# Patient Record
Sex: Male | Born: 1960 | Race: White | Hispanic: No | Marital: Married | Smoking: Former smoker
Health system: Southern US, Community
[De-identification: ages and names within clinical notes are randomized; demographics above are authoritative.]

## PROBLEM LIST (undated history)

## (undated) DIAGNOSIS — E785 Hyperlipidemia, unspecified: Secondary | ICD-10-CM

## (undated) DIAGNOSIS — I10 Essential (primary) hypertension: Secondary | ICD-10-CM

## (undated) DIAGNOSIS — E669 Obesity, unspecified: Secondary | ICD-10-CM

## (undated) DIAGNOSIS — I2510 Atherosclerotic heart disease of native coronary artery without angina pectoris: Secondary | ICD-10-CM

## (undated) HISTORY — DX: Obesity, unspecified: E66.9

## (undated) HISTORY — PX: OTHER SURGICAL HISTORY: SHX169

## (undated) HISTORY — PX: WISDOM TOOTH EXTRACTION: SHX21

## (undated) HISTORY — DX: Essential (primary) hypertension: I10

## (undated) HISTORY — DX: Hyperlipidemia, unspecified: E78.5

## (undated) HISTORY — DX: Atherosclerotic heart disease of native coronary artery without angina pectoris: I25.10

---

## 2005-10-05 ENCOUNTER — Emergency Department (HOSPITAL_COMMUNITY): Admission: EM | Admit: 2005-10-05 | Discharge: 2005-10-05 | Payer: Self-pay | Admitting: Emergency Medicine

## 2011-02-02 ENCOUNTER — Inpatient Hospital Stay (HOSPITAL_COMMUNITY): Payer: BC Managed Care – PPO

## 2011-02-02 ENCOUNTER — Emergency Department (HOSPITAL_COMMUNITY): Payer: BC Managed Care – PPO

## 2011-02-02 ENCOUNTER — Inpatient Hospital Stay (HOSPITAL_COMMUNITY)
Admission: EM | Admit: 2011-02-02 | Discharge: 2011-02-08 | DRG: 546 | Disposition: A | Discharge status: Home / Self Care | Payer: BC Managed Care – PPO | Source: Ambulatory Visit | Attending: Cardiothoracic Surgery | Admitting: Cardiothoracic Surgery

## 2011-02-02 DIAGNOSIS — R000 Tachycardia, unspecified: Secondary | ICD-10-CM | POA: Diagnosis not present

## 2011-02-02 DIAGNOSIS — D62 Acute posthemorrhagic anemia: Secondary | ICD-10-CM | POA: Diagnosis not present

## 2011-02-02 DIAGNOSIS — I2582 Chronic total occlusion of coronary artery: Secondary | ICD-10-CM | POA: Diagnosis present

## 2011-02-02 DIAGNOSIS — E8779 Other fluid overload: Secondary | ICD-10-CM | POA: Diagnosis not present

## 2011-02-02 DIAGNOSIS — I10 Essential (primary) hypertension: Secondary | ICD-10-CM | POA: Diagnosis present

## 2011-02-02 DIAGNOSIS — I2119 ST elevation (STEMI) myocardial infarction involving other coronary artery of inferior wall: Principal | ICD-10-CM | POA: Diagnosis present

## 2011-02-02 DIAGNOSIS — Z87891 Personal history of nicotine dependence: Secondary | ICD-10-CM

## 2011-02-02 DIAGNOSIS — E669 Obesity, unspecified: Secondary | ICD-10-CM | POA: Diagnosis present

## 2011-02-02 DIAGNOSIS — Z7902 Long term (current) use of antithrombotics/antiplatelets: Secondary | ICD-10-CM

## 2011-02-02 DIAGNOSIS — I2510 Atherosclerotic heart disease of native coronary artery without angina pectoris: Secondary | ICD-10-CM | POA: Diagnosis present

## 2011-02-02 DIAGNOSIS — R569 Unspecified convulsions: Secondary | ICD-10-CM | POA: Diagnosis present

## 2011-02-02 DIAGNOSIS — R079 Chest pain, unspecified: Secondary | ICD-10-CM

## 2011-02-02 DIAGNOSIS — Z7982 Long term (current) use of aspirin: Secondary | ICD-10-CM

## 2011-02-02 DIAGNOSIS — I4901 Ventricular fibrillation: Secondary | ICD-10-CM

## 2011-02-02 DIAGNOSIS — I469 Cardiac arrest, cause unspecified: Secondary | ICD-10-CM | POA: Diagnosis present

## 2011-02-02 DIAGNOSIS — Z8249 Family history of ischemic heart disease and other diseases of the circulatory system: Secondary | ICD-10-CM

## 2011-02-02 DIAGNOSIS — Z79899 Other long term (current) drug therapy: Secondary | ICD-10-CM

## 2011-02-02 DIAGNOSIS — E785 Hyperlipidemia, unspecified: Secondary | ICD-10-CM | POA: Diagnosis present

## 2011-02-02 HISTORY — PX: CARDIAC CATHETERIZATION: SHX172

## 2011-02-02 LAB — POCT I-STAT 4, (NA,K, GLUC, HGB,HCT)
Glucose, Bld: 160 mg/dL — ABNORMAL HIGH (ref 70–99)
Glucose, Bld: 119 mg/dL — ABNORMAL HIGH (ref 70–99)
HCT: 43 % (ref 39.0–52.0)
HCT: 28 % — ABNORMAL LOW (ref 39.0–52.0)
HCT: 42 % (ref 39.0–52.0)
HCT: 30 % — ABNORMAL LOW (ref 39.0–52.0)
HCT: 36 % — ABNORMAL LOW (ref 39.0–52.0)
Hemoglobin: 14.3 g/dL (ref 13.0–17.0)
Potassium: 4.3 mEq/L (ref 3.5–5.1)
Potassium: 4.3 mEq/L (ref 3.5–5.1)
Potassium: 5 mEq/L (ref 3.5–5.1)
Potassium: 3.9 mEq/L (ref 3.5–5.1)
Sodium: 135 mEq/L (ref 135–145)
Sodium: 128 mEq/L — ABNORMAL LOW (ref 135–145)
Sodium: 136 mEq/L (ref 135–145)

## 2011-02-02 LAB — CBC
HCT: 40.4 % (ref 39.0–52.0)
HCT: 35.8 % — ABNORMAL LOW (ref 39.0–52.0)
HCT: 33.5 % — ABNORMAL LOW (ref 39.0–52.0)
Hemoglobin: 12.9 g/dL — ABNORMAL LOW (ref 13.0–17.0)
Hemoglobin: 11.8 g/dL — ABNORMAL LOW (ref 13.0–17.0)
MCH: 31.8 pg (ref 26.0–34.0)
MCH: 31.5 pg (ref 26.0–34.0)
MCH: 31.1 pg (ref 26.0–34.0)
MCH: 31.1 pg (ref 26.0–34.0)
MCHC: 36.7 g/dL — ABNORMAL HIGH (ref 30.0–36.0)
MCHC: 36 g/dL (ref 30.0–36.0)
MCHC: 35.2 g/dL (ref 30.0–36.0)
MCV: 86.6 fL (ref 78.0–100.0)
MCV: 86.3 fL (ref 78.0–100.0)
MCV: 88.2 fL (ref 78.0–100.0)
Platelets: 284 10*3/uL (ref 150–400)
Platelets: 171 10*3/uL (ref 150–400)
Platelets: 156 10*3/uL (ref 150–400)
RBC: 5.31 MIL/uL (ref 4.22–5.81)
RBC: 4.6 MIL/uL (ref 4.22–5.81)
RBC: 4.15 MIL/uL — ABNORMAL LOW (ref 4.22–5.81)
RBC: 3.8 MIL/uL — ABNORMAL LOW (ref 4.22–5.81)
RDW: 12.2 % (ref 11.5–15.5)
RDW: 12.3 % (ref 11.5–15.5)
WBC: 12.5 10*3/uL — ABNORMAL HIGH (ref 4.0–10.5)
WBC: 14.6 10*3/uL — ABNORMAL HIGH (ref 4.0–10.5)
WBC: 11.9 10*3/uL — ABNORMAL HIGH (ref 4.0–10.5)

## 2011-02-02 LAB — POCT I-STAT 3, ART BLOOD GAS (G3+)
Acid-base deficit: 5 mmol/L — ABNORMAL HIGH (ref 0.0–2.0)
Acid-base deficit: 4 mmol/L — ABNORMAL HIGH (ref 0.0–2.0)
Acid-base deficit: 4 mmol/L — ABNORMAL HIGH (ref 0.0–2.0)
Bicarbonate: 21.2 mEq/L (ref 20.0–24.0)
Bicarbonate: 21.9 mEq/L (ref 20.0–24.0)
Bicarbonate: 22.4 mEq/L (ref 20.0–24.0)
O2 Saturation: 100 %
O2 Saturation: 93 %
O2 Saturation: 97 %
Patient temperature: 37.7
TCO2: 23 mmol/L (ref 0–100)
TCO2: 23 mmol/L (ref 0–100)
pCO2 arterial: 42.1 mmHg (ref 35.0–45.0)
pCO2 arterial: 39.1 mmHg (ref 35.0–45.0)
pH, Arterial: 7.357 (ref 7.350–7.450)
pO2, Arterial: 323 mmHg — ABNORMAL HIGH (ref 80.0–100.0)
pO2, Arterial: 119 mmHg — ABNORMAL HIGH (ref 80.0–100.0)

## 2011-02-02 LAB — PROTIME-INR
INR: 1.27 (ref 0.00–1.49)
INR: 1.39 (ref 0.00–1.49)
Prothrombin Time: 17.3 seconds — ABNORMAL HIGH (ref 11.6–15.2)

## 2011-02-02 LAB — DIFFERENTIAL
Basophils Relative: 1 % (ref 0–1)
Eosinophils Relative: 3 % (ref 0–5)
Lymphs Abs: 2.6 10*3/uL (ref 0.7–4.0)
Neutrophils Relative %: 56 % (ref 43–77)

## 2011-02-02 LAB — MAGNESIUM: Magnesium: 2.6 mg/dL — ABNORMAL HIGH (ref 1.5–2.5)

## 2011-02-02 LAB — POCT I-STAT 3, VENOUS BLOOD GAS (G3P V)
Bicarbonate: 20 mEq/L (ref 20.0–24.0)
O2 Saturation: 74 %
TCO2: 21 mmol/L (ref 0–100)
pCO2, Ven: 44 mmHg — ABNORMAL LOW (ref 45.0–50.0)

## 2011-02-02 LAB — COMPREHENSIVE METABOLIC PANEL
ALT: 35 U/L (ref 0–53)
AST: 26 U/L (ref 0–37)
AST: 46 U/L — ABNORMAL HIGH (ref 0–37)
Alkaline Phosphatase: 56 U/L (ref 39–117)
BUN: 14 mg/dL (ref 6–23)
Creatinine, Ser: 0.84 mg/dL (ref 0.50–1.35)
GFR calc Af Amer: >60 mL/min (ref >60)
GFR calc Af Amer: >60 mL/min (ref >60)
GFR calc non Af Amer: >60 mL/min (ref >60)
Sodium: 135 mEq/L (ref 135–145)
Sodium: 133 mEq/L — ABNORMAL LOW (ref 135–145)
Total Bilirubin: 0.6 mg/dL (ref 0.3–1.2)
Total Protein: 6 g/dL (ref 6.0–8.3)

## 2011-02-02 LAB — CREATININE, SERUM
Creatinine, Ser: 0.75 mg/dL (ref 0.50–1.35)
GFR calc Af Amer: >60 mL/min (ref >60)
GFR calc non Af Amer: >60 mL/min (ref >60)

## 2011-02-02 LAB — GLUCOSE, CAPILLARY

## 2011-02-02 LAB — POCT I-STAT, CHEM 8
BUN: 15 mg/dL (ref 6–23)
Calcium, Ion: 1.25 mmol/L (ref 1.12–1.32)
Calcium, Ion: 1.36 mmol/L — ABNORMAL HIGH (ref 1.12–1.32)
Chloride: 103 mEq/L (ref 96–112)
Creatinine, Ser: 1 mg/dL (ref 0.50–1.35)
Creatinine, Ser: 0.8 mg/dL (ref 0.50–1.35)
Glucose, Bld: 135 mg/dL — ABNORMAL HIGH (ref 70–99)
Glucose, Bld: 162 mg/dL — ABNORMAL HIGH (ref 70–99)
HCT: 33 % — ABNORMAL LOW (ref 39.0–52.0)
Hemoglobin: 17.3 g/dL — ABNORMAL HIGH (ref 13.0–17.0)
Hemoglobin: 11.2 g/dL — ABNORMAL LOW (ref 13.0–17.0)
Potassium: 4.3 mEq/L (ref 3.5–5.1)

## 2011-02-02 LAB — CARDIAC PANEL(CRET KIN+CKTOT+MB+TROPI)
CK, MB: 3.7 ng/mL (ref 0.3–4.0)
Relative Index: 2.7 — ABNORMAL HIGH (ref 0.0–2.5)
Total CK: 139 U/L (ref 7–232)
Troponin I: <0.3 ng/mL (ref <0.30)

## 2011-02-02 LAB — HEMOGLOBIN A1C: Mean Plasma Glucose: 111 mg/dL (ref <117)

## 2011-02-02 LAB — PLATELET COUNT: Platelets: 170 10*3/uL (ref 150–400)

## 2011-02-02 LAB — APTT: aPTT: 31 seconds (ref 24–37)

## 2011-02-02 LAB — POCT I-STAT GLUCOSE: Glucose, Bld: 154 mg/dL — ABNORMAL HIGH (ref 70–99)

## 2011-02-03 ENCOUNTER — Inpatient Hospital Stay (HOSPITAL_COMMUNITY): Payer: BC Managed Care – PPO

## 2011-02-03 HISTORY — PX: CORONARY ARTERY BYPASS GRAFT: SHX141

## 2011-02-03 LAB — POCT I-STAT 3, ART BLOOD GAS (G3+)
Bicarbonate: 26.9 mEq/L — ABNORMAL HIGH (ref 20.0–24.0)
O2 Saturation: 94 %
pCO2 arterial: 50.8 mmHg — ABNORMAL HIGH (ref 35.0–45.0)
pCO2 arterial: 51.1 mmHg — ABNORMAL HIGH (ref 35.0–45.0)
pH, Arterial: 7.317 — ABNORMAL LOW (ref 7.350–7.450)
pH, Arterial: 7.332 — ABNORMAL LOW (ref 7.350–7.450)
pO2, Arterial: 75 mmHg — ABNORMAL LOW (ref 80.0–100.0)
pO2, Arterial: 80 mmHg (ref 80.0–100.0)

## 2011-02-03 LAB — CREATININE, SERUM
Creatinine, Ser: 0.86 mg/dL (ref 0.50–1.35)
GFR calc Af Amer: >60 mL/min (ref >60)
GFR calc non Af Amer: >60 mL/min (ref >60)

## 2011-02-03 LAB — BASIC METABOLIC PANEL
BUN: 9 mg/dL (ref 6–23)
CO2: 28 mEq/L (ref 19–32)
Calcium: 8.8 mg/dL (ref 8.4–10.5)
Chloride: 105 mEq/L (ref 96–112)
Creatinine, Ser: 0.89 mg/dL (ref 0.50–1.35)
GFR calc Af Amer: >60 mL/min (ref >60)
GFR calc non Af Amer: >60 mL/min (ref >60)
Glucose, Bld: 158 mg/dL — ABNORMAL HIGH (ref 70–99)
Potassium: 3.5 mEq/L (ref 3.5–5.1)
Sodium: 141 mEq/L (ref 135–145)

## 2011-02-03 LAB — CBC
HCT: 32.3 % — ABNORMAL LOW (ref 39.0–52.0)
HCT: 33 % — ABNORMAL LOW (ref 39.0–52.0)
Hemoglobin: 11.5 g/dL — ABNORMAL LOW (ref 13.0–17.0)
Hemoglobin: 11.4 g/dL — ABNORMAL LOW (ref 13.0–17.0)
MCH: 31.5 pg (ref 26.0–34.0)
MCH: 31.3 pg (ref 26.0–34.0)
MCHC: 35.6 g/dL (ref 30.0–36.0)
MCHC: 34.5 g/dL (ref 30.0–36.0)
MCV: 88.5 fL (ref 78.0–100.0)
MCV: 90.7 fL (ref 78.0–100.0)
Platelets: 144 10*3/uL — ABNORMAL LOW (ref 150–400)
Platelets: 131 10*3/uL — ABNORMAL LOW (ref 150–400)
RBC: 3.65 MIL/uL — ABNORMAL LOW (ref 4.22–5.81)
RBC: 3.64 MIL/uL — ABNORMAL LOW (ref 4.22–5.81)
RDW: 12.4 % (ref 11.5–15.5)
RDW: 12.6 % (ref 11.5–15.5)
WBC: 12.5 10*3/uL — ABNORMAL HIGH (ref 4.0–10.5)
WBC: 15.9 10*3/uL — ABNORMAL HIGH (ref 4.0–10.5)

## 2011-02-03 LAB — GLUCOSE, CAPILLARY
Glucose-Capillary: 85 mg/dL (ref 70–99)
Glucose-Capillary: 108 mg/dL — ABNORMAL HIGH (ref 70–99)

## 2011-02-03 LAB — PROTIME-INR
INR: 1.36 (ref 0.00–1.49)
Prothrombin Time: 17 seconds — ABNORMAL HIGH (ref 11.6–15.2)

## 2011-02-03 LAB — POCT I-STAT, CHEM 8
BUN: 11 mg/dL (ref 6–23)
Calcium, Ion: 1.26 mmol/L (ref 1.12–1.32)
Chloride: 99 mEq/L (ref 96–112)
Creatinine, Ser: 1 mg/dL (ref 0.50–1.35)
Glucose, Bld: 171 mg/dL — ABNORMAL HIGH (ref 70–99)
HCT: 34 % — ABNORMAL LOW (ref 39.0–52.0)
Potassium: 3.9 mEq/L (ref 3.5–5.1)

## 2011-02-03 LAB — MAGNESIUM
Magnesium: 2.2 mg/dL (ref 1.5–2.5)
Magnesium: 2.1 mg/dL (ref 1.5–2.5)

## 2011-02-04 ENCOUNTER — Inpatient Hospital Stay (HOSPITAL_COMMUNITY): Payer: BC Managed Care – PPO

## 2011-02-04 LAB — CBC
HCT: 31.9 % — ABNORMAL LOW (ref 39.0–52.0)
Hemoglobin: 11.1 g/dL — ABNORMAL LOW (ref 13.0–17.0)
MCH: 31.4 pg (ref 26.0–34.0)
MCHC: 34.8 g/dL (ref 30.0–36.0)
MCV: 90.1 fL (ref 78.0–100.0)
Platelets: 131 10*3/uL — ABNORMAL LOW (ref 150–400)
RBC: 3.54 MIL/uL — ABNORMAL LOW (ref 4.22–5.81)
RDW: 12.6 % (ref 11.5–15.5)
WBC: 15.7 10*3/uL — ABNORMAL HIGH (ref 4.0–10.5)

## 2011-02-04 LAB — BASIC METABOLIC PANEL
BUN: 12 mg/dL (ref 6–23)
CO2: 33 mEq/L — ABNORMAL HIGH (ref 19–32)
Calcium: 8.8 mg/dL (ref 8.4–10.5)
Chloride: 98 mEq/L (ref 96–112)
Creatinine, Ser: 0.95 mg/dL (ref 0.50–1.35)
GFR calc Af Amer: >60 mL/min (ref >60)
GFR calc non Af Amer: >60 mL/min (ref >60)
Glucose, Bld: 130 mg/dL — ABNORMAL HIGH (ref 70–99)
Potassium: 3.7 mEq/L (ref 3.5–5.1)
Sodium: 137 mEq/L (ref 135–145)

## 2011-02-04 LAB — GLUCOSE, CAPILLARY
Glucose-Capillary: 152 mg/dL — ABNORMAL HIGH (ref 70–99)
Glucose-Capillary: 158 mg/dL — ABNORMAL HIGH (ref 70–99)
Glucose-Capillary: 129 mg/dL — ABNORMAL HIGH (ref 70–99)
Glucose-Capillary: 135 mg/dL — ABNORMAL HIGH (ref 70–99)
Glucose-Capillary: 132 mg/dL — ABNORMAL HIGH (ref 70–99)

## 2011-02-05 ENCOUNTER — Inpatient Hospital Stay (HOSPITAL_COMMUNITY): Payer: BC Managed Care – PPO

## 2011-02-05 DIAGNOSIS — I2119 ST elevation (STEMI) myocardial infarction involving other coronary artery of inferior wall: Secondary | ICD-10-CM

## 2011-02-05 DIAGNOSIS — IMO0001 Reserved for inherently not codable concepts without codable children: Secondary | ICD-10-CM

## 2011-02-05 DIAGNOSIS — E1165 Type 2 diabetes mellitus with hyperglycemia: Secondary | ICD-10-CM

## 2011-02-05 LAB — BASIC METABOLIC PANEL
CO2: 34 mEq/L — ABNORMAL HIGH (ref 19–32)
Calcium: 8.7 mg/dL (ref 8.4–10.5)
GFR calc non Af Amer: 86 mL/min — ABNORMAL LOW (ref >90)
Glucose, Bld: 106 mg/dL — ABNORMAL HIGH (ref 70–99)
Potassium: 3.7 mEq/L (ref 3.5–5.1)
Sodium: 136 mEq/L (ref 135–145)

## 2011-02-05 LAB — GLUCOSE, CAPILLARY
Glucose-Capillary: 118 mg/dL — ABNORMAL HIGH (ref 70–99)
Glucose-Capillary: 124 mg/dL — ABNORMAL HIGH (ref 70–99)
Glucose-Capillary: 114 mg/dL — ABNORMAL HIGH (ref 70–99)
Glucose-Capillary: 121 mg/dL — ABNORMAL HIGH (ref 70–99)

## 2011-02-05 LAB — CBC
MCV: 89.7 fL (ref 78.0–100.0)
Platelets: 122 10*3/uL — ABNORMAL LOW (ref 150–400)
RBC: 3.02 MIL/uL — ABNORMAL LOW (ref 4.22–5.81)
WBC: 11.1 10*3/uL — ABNORMAL HIGH (ref 4.0–10.5)

## 2011-02-05 NOTE — Cardiovascular Report (Signed)
NAME:  [name], [name] NO.:  [id number]  MEDICAL RECORD NO.:  [id number]  LOCATION:  2399                         FACILITY:  [facility]  PHYSICIAN:  Verne Carrow, MDDATE OF BIRTH:  14-Oct-1960  DATE OF PROCEDURE:  02/02/2011 DATE OF DISCHARGE:                           CARDIAC CATHETERIZATION   PROCEDURE PERFORMED: 1. Left heart catheterization. 2. Selective coronary angiography. 3. Left ventricular angiogram. 4. PTCA of the totally occluded proximal circumflex artery. 5. Thrombectomy of the circumflex artery. 6. Intra-aortic balloon pump placement in the right femoral artery.  OPERATOR:  Verne Carrow, MD  INDICATIONS:  This is a 50 year old Caucasian male with a history of hypertension, who presented to the [facility] Emergency Department earlier this morning with a complaint of chest pain.  While the patient was being evaluated, he had a ventricular fibrillation arrest requiring one defibrillatory shock.  He then returned with sinus bradycardia and was given atropine.  A code STEMI was called.  There was no ST elevation on the EKG; however, given the patient's arrhythmia, we thought it was important to perform a left heart catheterization.  The patient was brought emergently to the cardiac catheterization laboratory from the emergency department.  Emergency consent was obtained.  I did speak to the patient's wife prior to the procedure.  PROCEDURE IN DETAIL:  The patient arrived in cath lab, confused and agitated.  I felt it was appropriate to perform mechanical ventilation with intubation and sedation because of his confusion and odd behavior. Anesthesia was called and came and intubated the patient.  The patient was given appropriate sedation.  Once the patient was sedated, we obtained access into the right femoral artery.  Access was delayed significantly by the patient's agitation and need for intubation with sedation.  Finally, we were  able to get a 6-French sheath in the right femoral artery.  Diagnostic heart catheterization was performed with standard diagnostic catheters.  Pigtail catheters used to perform a left ventricular angiogram.  The patient was found to have ulcerated distal left main stenosis, ostial LAD stenosis and totally occluded proximal circumflex artery.  Cardiothoracic surgery was called.  Dr. Kathlee Nations Trigt was at the bedside reviewing the film.  I felt that was important to open the totally occluded circumflex artery here in the cath lab with a balloon only.  We then gave the patient a bolus of heparin.  At this point, XB 3.0 guide was used to selectively engage the left main artery. A cougar intracoronary wire was passed down the circumflex artery. Reperfusion was established with wire across the lesion.  A 2.5 x 12-mm balloon was inflated twice in the stenosis restoring good flow into the distal circumflex artery.  We then performed a thrombectomy with a Pronto thrombectomy catheter.  At this point, there was excellent flow down the circumflex artery.  The very distal portion of one of the small in caliber obtuse marginal branches did have a thrombus occluding the most distal portion of this branch.  The patient was hemodynamically stable.  A balloon pump was placed in the right femoral artery.  Balloon pump was set at one-to-one.  The patient was hemodynamically stable and was taken emergently  to the operating room.  HEMODYNAMIC FINDINGS:  Central aortic pressure 127/92.  Left ventricular pressure 124/25 over 29.  ANGIOGRAPHIC FINDINGS: 1. The left main coronary artery had an ulcerated distal 80% stenosis. 2. The left anterior descending had an ostial 90% stenosis.  The     remainder of the vessel was relatively disease-free with minor     luminal irregularities.  The diagonal branch had minor luminal     irregularities. 3. The circumflex artery had a 100% proximal occlusion, circumflex      artery was a large dominant vessel. 4. The right coronary artery was a small nondominant vessel.  The     right coronary artery gave off collaterals to the distal circumflex     artery. 5. Left ventricular angiogram was performed with a hand injection and     showed hypokinesis of the inferior wall with ejection fraction of     40%.  IMPRESSION: 1. Acute inferolateral ST elevation myocardial infarction. 2. Ventricular fibrillatory arrest with salvage PCI. 3. Occluded circumflex artery, now status post balloon angioplasty     only of the occluded circumflex artery. 4. Distal left main and proximal left anterior descending artery     stenosis. 5. Left ventricular systolic dysfunction.  RECOMMENDATIONS:  This patient was given heparin only.  We opened the circumflex artery with a balloon-only as he needs emergent coronary artery bypass grafting surgery.  The case was discussed with Dr. Kathlee Nations Trigt at the bedside in the cath lab.  The patient has been evaluated, and plans are to take the patient emergently to the operating room for coronary artery bypass grafting surgery.     Verne Carrow, MD     CM/MEDQ  D:  02/02/2011  T:  02/02/2011  Job:  [id number]  Electronically Signed by Verne Carrow MD on 02/05/2011 09:10:43 AM

## 2011-02-06 ENCOUNTER — Inpatient Hospital Stay (HOSPITAL_COMMUNITY): Payer: BC Managed Care – PPO

## 2011-02-06 DIAGNOSIS — I2510 Atherosclerotic heart disease of native coronary artery without angina pectoris: Secondary | ICD-10-CM

## 2011-02-06 DIAGNOSIS — IMO0001 Reserved for inherently not codable concepts without codable children: Secondary | ICD-10-CM

## 2011-02-06 DIAGNOSIS — E1165 Type 2 diabetes mellitus with hyperglycemia: Secondary | ICD-10-CM

## 2011-02-06 DIAGNOSIS — I059 Rheumatic mitral valve disease, unspecified: Secondary | ICD-10-CM

## 2011-02-06 LAB — GLUCOSE, CAPILLARY
Glucose-Capillary: 125 mg/dL — ABNORMAL HIGH (ref 70–99)
Glucose-Capillary: 91 mg/dL (ref 70–99)
Glucose-Capillary: 78 mg/dL (ref 70–99)
Glucose-Capillary: 148 mg/dL — ABNORMAL HIGH (ref 70–99)

## 2011-02-06 LAB — CBC
HCT: 29.6 % — ABNORMAL LOW (ref 39.0–52.0)
Hemoglobin: 10.1 g/dL — ABNORMAL LOW (ref 13.0–17.0)
MCH: 30.9 pg (ref 26.0–34.0)
MCHC: 34.1 g/dL (ref 30.0–36.0)
MCV: 90.5 fL (ref 78.0–100.0)
Platelets: 219 10*3/uL (ref 150–400)
RBC: 3.27 MIL/uL — ABNORMAL LOW (ref 4.22–5.81)
RDW: 12.6 % (ref 11.5–15.5)
WBC: 9.8 10*3/uL (ref 4.0–10.5)

## 2011-02-06 LAB — BASIC METABOLIC PANEL
BUN: 19 mg/dL (ref 6–23)
CO2: 29 mEq/L (ref 19–32)
Calcium: 8.9 mg/dL (ref 8.4–10.5)
Chloride: 98 mEq/L (ref 96–112)
Creatinine, Ser: 0.76 mg/dL (ref 0.50–1.35)
GFR calc Af Amer: >90 mL/min (ref >90)
GFR calc non Af Amer: >90 mL/min (ref >90)
Glucose, Bld: 116 mg/dL — ABNORMAL HIGH (ref 70–99)
Potassium: 3.9 mEq/L (ref 3.5–5.1)
Sodium: 135 mEq/L (ref 135–145)

## 2011-02-06 LAB — CROSSMATCH
ABO/RH(D): O POS
Antibody Screen: NEGATIVE

## 2011-02-07 DIAGNOSIS — I359 Nonrheumatic aortic valve disorder, unspecified: Secondary | ICD-10-CM

## 2011-02-07 LAB — GLUCOSE, CAPILLARY
Glucose-Capillary: 102 mg/dL — ABNORMAL HIGH (ref 70–99)
Glucose-Capillary: 105 mg/dL — ABNORMAL HIGH (ref 70–99)
Glucose-Capillary: 81 mg/dL (ref 70–99)

## 2011-02-08 NOTE — Op Note (Signed)
NAME:  [name], [name] NO.:  [id number]  MEDICAL RECORD NO.:  [id number]  LOCATION:  2315                         FACILITY:  [facility]  PHYSICIAN:  Kerin Perna, M.D.  DATE OF BIRTH:  09-Dec-1960  DATE OF PROCEDURE:  02/02/2011 DATE OF DISCHARGE:                              OPERATIVE REPORT   OPERATIONS: 1. Emergency coronary artery bypass grafting x4 (left internal mammary     artery to left anterior descending, saphenous vein graft to     diagonal, sequential saphenous vein graft to obtuse marginal 1 and     obtuse marginal 2). 2. Endoscopic harvest of the right leg greater saphenous vein.  SURGEON:  Kerin Perna, MD  ASSISTANTS: 1. Salvatore Decent. Cornelius Moras, MD 2. Coral Ceo, PA-C  ANESTHESIA:  General by Dr. Laverle Hobby.  INDICATIONS:  The patient is a 50 year old gentleman who presented to the emergency department with acute onset of chest pain and had an episode of ventricular fibrillation and arrest and seizure.  He was intubated and went directly to the cath lab.  Dr. Clifton James demonstrated on cath a 99% stenosis of left main and total occlusion of the right coronary with a nondominant right and EF of 30%.  A PTCA of the circumflex opened that vessel and clot was extracted.  A balloon pump was placed, and the patient was taken for emergency coronary bypass surgery.  I discussed the situation with the patient's wife and obtained informed consent.  We discussed the indications, benefits, alternatives, and risks which include stroke, MI, bleeding, infection, and death.  She understood and agreed to proceed.  PROCEDURE:  The patient was brought directly from the cath lab to the operating room on balloon pump support, intubated.  The patient had a transesophageal echo placed by the anesthesiologist and a Swan-Ganz catheter was placed as well as a radial A-line.  The patient remained hemodynamically stable.  The patient was prepped and draped as a  sterile field.  A proper time-out was performed.  A sternal incision was made in the saphenous vein and was harvested endoscopically from the right leg. The left internal mammary artery was harvested as a pedicle graft from its origin at the subclavian vessels and was a good vessel with very good flow.  A sternal retractor was placed and the pericardium was opened and suspended.  Pursestrings  were placed in the ascending aorta and right atrium, and heparin was administered after the vein was harvested and found to be adequate.  The patient was cannulated and placed on cardiopulmonary bypass.  The coronaries were then identified for grafting.  The distal circumflex vessels were too small to graft. The right coronary was small and had no significant disease.  The LAD was intramyocardial and was dissected out.  The OM-1 and OM-2 vessels were adequate.  There was some disease in the proximal diagonal and that vessel was dissected and identified.  The patient was then cooled to 32 degrees and cardioplegia catheters were placed both antegrade andretrograde, cold blood cardioplegia.  The crossclamp was applied.  An 800 mL of cold blood cardioplegia was delivered in split doses between the antegrade and retrograde catheters, and  a good cardioplegic arrest was achieved with septal temperature of less than 10 degrees. Cardioplegia was then delivered every 20 minutes or less while the crossclamp was in place.  The distal coronary anastomoses were then performed.  The first distal anastomosis was to the diagonal branch of the LAD.  This is a 1.5-mm vessel.  A reverse saphenous vein was sewn end-to-side with running 8-0 Prolene due to the thin nature of the vessel wall.  A good flow was documented with cardioplegia.  The second and third distal anastomoses consisted of a sequential vein graft to the OM-1 and OM-2.  The OM-1 was a large 1.5-mm vessel.  The reverse saphenous vein was sewn  side-to-side to the OM-1 with running 7-0 Prolene and there was a good flow through the graft.  The third distal anastomosis was the continuation of the sequential vein to the OM-2.  This is a small 1.3- to 1.4-mm vessel and the end vein was sewn end-to-side with running 7-0 Prolene with good flow through the graft.  Cardioplegia was redosed.  The fourth distal anastomosis was to the distal third of the LAD.  It was intramyocardial. It was opened and it was a 1.5-mm vessel.  The left IMA pedicle was brought through an opening created in the left lateral pericardium, was brought down onto the LAD, and sewn end-to-side with running 8-0 Prolene.  There was good flow through the anastomosis after briefly releasing the pedicle bulldog on the mammary pedicle.  The bulldog was reapplied and pedicle was secured to the epicardium with 6-0 Prolene. Cardioplegia was redosed.  While the crossclamp was still in place, two proximal vein anastomoses were performed using a 4.5-mm punch running 7-0 Prolene.  Prior to tying down the final proximal anastomosis, air was vented from the coronaries with a dose of retrograde warm blood cardioplegia.  The crossclamp wasthen removed.  The heart resumed a spontaneous rhythm.  The grafts were checked and found to be hemostatic.  There was a good flow through the grafts.  The patient was rewarmed and reperfused.  Temporary wires were applied. Pacing wires were placed on the right atrium and right ventricle.  The lungs were expanded and ventilator was resumed.  The patient was then weaned off bypass on low-dose dopamine, milrinone with stable hemodynamics.  The echo showed improvement in global LV function.  There was minimal 1+ MR.  Protamine was administered without adverse reaction. The cannula was removed.  The mediastinum was irrigated with warm saline.  The superior pericardial fat was closed over the aorta.  The leg was irrigated and closed in a standard  fashion.  One mediastinal and a left pleural chest tube were placed and brought through separate incisions.  The sternum was closed with interrupted steel wire.  The pectoralis fascia was closed with running #1 Vicryl.  The subcutaneous and skin layers were closed with running Vicryl, and sterile dressings were applied.  Total cardiopulmonary bypass time was 112 minutes.     Kerin Perna, M.D.     PV/MEDQ  D:  02/02/2011  T:  02/03/2011  Job:  [id number]  cc:   Verne Carrow, MD Merlene Laughter. Renae Gloss, M.D.  Electronically Signed by Kerin Perna M.D. on 02/08/2011 09:32:03 AM
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
## 2011-02-22 ENCOUNTER — Encounter: Payer: Self-pay | Admitting: Physician Assistant

## 2011-02-22 ENCOUNTER — Other Ambulatory Visit: Payer: Self-pay | Admitting: Cardiothoracic Surgery

## 2011-02-22 DIAGNOSIS — I2510 Atherosclerotic heart disease of native coronary artery without angina pectoris: Secondary | ICD-10-CM

## 2011-02-23 ENCOUNTER — Ambulatory Visit (HOSPITAL_COMMUNITY)
Admission: RE | Admit: 2011-02-23 | Discharge: 2011-02-23 | Disposition: A | Discharge status: Home / Self Care | Payer: BC Managed Care – PPO | Source: Ambulatory Visit | Attending: Cardiovascular Disease | Admitting: Cardiovascular Disease

## 2011-02-23 ENCOUNTER — Encounter: Payer: Self-pay | Admitting: Physician Assistant

## 2011-02-23 ENCOUNTER — Ambulatory Visit (INDEPENDENT_AMBULATORY_CARE_PROVIDER_SITE_OTHER): Payer: BC Managed Care – PPO | Admitting: Physician Assistant

## 2011-02-23 VITALS — BP 110/78 | HR 70 | Ht 60.6 in | Wt 186.0 lb

## 2011-02-23 DIAGNOSIS — J90 Pleural effusion, not elsewhere classified: Secondary | ICD-10-CM | POA: Insufficient documentation

## 2011-02-23 DIAGNOSIS — E785 Hyperlipidemia, unspecified: Secondary | ICD-10-CM

## 2011-02-23 DIAGNOSIS — I10 Essential (primary) hypertension: Secondary | ICD-10-CM

## 2011-02-23 DIAGNOSIS — R079 Chest pain, unspecified: Secondary | ICD-10-CM | POA: Insufficient documentation

## 2011-02-23 DIAGNOSIS — I2510 Atherosclerotic heart disease of native coronary artery without angina pectoris: Secondary | ICD-10-CM | POA: Insufficient documentation

## 2011-02-23 LAB — BASIC METABOLIC PANEL
BUN: 16 mg/dL (ref 6–23)
CO2: 20 mEq/L (ref 19–32)
Chloride: 102 mEq/L (ref 96–112)
Creatinine, Ser: 1 mg/dL (ref 0.4–1.5)
Glucose, Bld: 83 mg/dL (ref 70–99)
Potassium: 4.8 mEq/L (ref 3.5–5.1)

## 2011-02-23 NOTE — Assessment & Plan Note (Signed)
Controlled.  He has had some low blood pressures.  I advised him to contact us if this continues and we can decrease his lisinopril to 5 mg a day.  Check a basic metabolic panel today to followup on renal function and potassium in the setting of ACE inhibitor.

## 2011-02-23 NOTE — Assessment & Plan Note (Signed)
Check lipids and LFTs in 6 weeks. 

## 2011-02-23 NOTE — Progress Notes (Signed)
History of Present Illness: Primary Cardiologist:  Dr. Verne Carrow   [name] is a 50 y.o. male who presents for post hospital follow up.  He was admitted 9/28-10/3.  He presented with an inferolateral ST elevation myocardial infarction.  He suffered V. Fib arrest in the emergency room and required defibrillation x2 and intubation.  Cardiac catheterization 02/02/11: dLM 70-80%, oLAD 90%, pCFX occluded with L-R collats, EF 40% with inf HK.  He underwent salvage PCI with thrombectomy and balloon angioplasty of the occluded circumflex.  He was then taken for emergent bypass.  Grafts included L-LAD, S-Dx, S-OM1&OM2.  His postoperative course was uneventful.  He remained in sinus rhythm.  Plavix was added post op due to presentation with MI and PCI.  Echocardiogram 02/06/11: Mild LVH, EF 65%, mild MR, mild RVE, mildly reduced RVSF.  Pertinent labs: Hemoglobin 10.1, potassium 3.9, creatinine 0.76, hemoglobin A1c 5.5.  He is slowly progressing.  Notes chest soreness is much better.  Walking about 25 minutes a day.  Feels very tired sometimes after his walk.  Sleeps in a recliner b/c he has never liked sleeping on his back.  No orthopnea, PND.  No edema.  Weights coming down.  BPs at home look great except one day 93/58.  Felt weak with this BP.  No fevers.  Feels cold sometimes and feels hot other times.  Has some pleuritic chest pain lower thoracic area.  No cough.  No syncope.    Past Medical History  Diagnosis Date  . Obesity   . CAD (coronary artery disease)     IL STEMI 9/12:  Cardiac catheterization 02/02/11: dLM 70-80%, oLAD 90%, pCFX occluded with L-R collats, EF 40% with inf HK.  He underwent salvage PCI with thrombectomy and balloon angioplasty of the occluded circumflex.  He was then taken for emergent bypass.  Grafts included L-LAD, S-Dx, S-OM1&OM2  . HLD (hyperlipidemia)   . HTN (hypertension)     Current Outpatient Prescriptions  Medication Sig Dispense Refill  . aspirin 81 MG  tablet Take 81 mg by mouth daily.        . clopidogrel (PLAVIX) 75 MG tablet Take 75 mg by mouth daily.        Marland Kitchen lisinopril (PRINIVIL,ZESTRIL) 5 MG tablet Take 10 mg by mouth daily.       . metoprolol tartrate (LOPRESSOR) 25 MG tablet Take 25 mg by mouth 2 (two) times daily.        Marland Kitchen oxycodone (OXY-IR) 5 MG capsule Take 5 mg by mouth every 4 (four) hours as needed.        . simvastatin (ZOCOR) 40 MG tablet Take 40 mg by mouth at bedtime.        . traMADol (ULTRAM) 50 MG tablet Take 50 mg by mouth every 6 (six) hours as needed.          Allergies: No Known Allergies  Social history:  Ex-smoker  ROS:  Please see the history of present illness.  He denies vomiting, diarrhea, melena, hematochezia, hematuria, rashes.  All other systems reviewed and negative.   Vital Signs: BP 110/78  Pulse 70  Ht 5' 0.6" (1.539 m)  Wt 186 lb (84.369 kg)  BMI 35.61 kg/m2  PHYSICAL EXAM: Well nourished, well developed, in no acute distress HEENT: normal Neck: no JVD Cardiac:  normal S1, S2; RRR; no murmur Chest: Median sternotomy wound well healed without erythema or discharge Lungs:  Decreased breath sound sin the bases bilaterally, no wheezing, rhonchi or rales ,  No egophony Abd: soft, nontender, no hepatomegaly Ext: no edema Skin: warm and dry Neuro:  CNs 2-12 intact, no focal abnormalities noted  EKG:  Sinus rhythm, heart rate 71, normal axis, nonspecific ST-T wave changes  ASSESSMENT AND PLAN:

## 2011-02-23 NOTE — Assessment & Plan Note (Signed)
Pleuritic chest pain.  He does not have any signs of pleural effusion on exam.  I will go ahead and have him obtain his chest x-ray today.  I have encouraged him to continue to use his incentive spirometer.

## 2011-02-23 NOTE — Patient Instructions (Addendum)
Your physician recommends that you schedule a follow-up appointment in: 4-6 WEEKS WITH DR. Clifton James PER Tereso Newcomer, PA-C  Your physician recommends that you return for lab work in: TODAY BMET 414.01 CAD, 401.1 HTN  Your physician recommends that you return for lab work in: 04/06/11 FASTING LIVER/LIPID PANEL 272.4 HYPERLIPIDEMIA  A chest x-ray DX 786.50 CHEST PAIN, 414.01 CAD YOU WILL HAVE THIS DONE @ [facility] TODAY takes a picture of the organs and structures inside the chest, including the heart, lungs, and blood vessels. This test can show several things, including, whether the heart is enlarges; whether fluid is building up in the lungs; and whether pacemaker / defibrillator leads are still in place.

## 2011-02-23 NOTE — Assessment & Plan Note (Signed)
Slowly progressing post bypass.  Overall, I think he is doing well.  I encouraged him to pursue cardiac rehabilitation.  He does live in [place].  He will think about this.  Continue aspirin and Plavix and statin.  Followup with Dr. Clifton James in 4-6 weeks.

## 2011-02-26 NOTE — Discharge Summary (Signed)
  NAME:  [name], ZIRBES NO.:  [id number]  MEDICAL RECORD NO.:  [id number]  LOCATION:  2003                         FACILITY:  [facility]  PHYSICIAN:  Kerin Perna, M.D.  DATE OF BIRTH:  1961/02/10  DATE OF ADMISSION:  02/02/2011 DATE OF DISCHARGE:  02/08/2011                              DISCHARGE SUMMARY   ADDENDUM:  [name] has been seen and evaluated on the morning of February 08, 2011, and is deemed ready for discharge home at this time. However, several medication changes have been made from the previously dictated discharge summary.  The patient's lisinopril has been increased to 10 mg daily secondary to a slight increase in his blood pressure running 120s to 130s systolic. Also because of cost issues, his Crestor has been discontinued, and he will be started on Zocor 40 mg daily.  Additionally, he has been having some breakthrough pain, and we have added Ultram 50 to 100 mg q.4 h. p.r.n. to be taken alternately with his oxycodone for pain.  Otherwise, there are no changes from the previously dictated discharge summary.     Coral Ceo, P.A.   ______________________________ Kerin Perna, M.D.    GC/MEDQ  D:  02/08/2011  T:  02/08/2011  Job:  [id number]  cc:   Verne Carrow, MD  Electronically Signed by Weldon Inches. on 02/21/2011 10:33:20 AM Electronically Signed by Kerin Perna M.D. on 02/26/2011 07:58:10 AM

## 2011-02-27 ENCOUNTER — Telehealth: Payer: Self-pay | Admitting: Cardiology

## 2011-02-27 NOTE — Telephone Encounter (Signed)
Marylene Land calling from cardiac rehab . Need the referral sign. Will be re-faxing over.

## 2011-02-28 ENCOUNTER — Encounter: Payer: Self-pay | Admitting: Cardiothoracic Surgery

## 2011-02-28 ENCOUNTER — Ambulatory Visit (INDEPENDENT_AMBULATORY_CARE_PROVIDER_SITE_OTHER): Payer: Self-pay | Admitting: Cardiothoracic Surgery

## 2011-02-28 ENCOUNTER — Ambulatory Visit
Admission: RE | Admit: 2011-02-28 | Discharge: 2011-02-28 | Disposition: A | Discharge status: Home / Self Care | Payer: BC Managed Care – PPO | Source: Ambulatory Visit | Attending: Cardiothoracic Surgery | Admitting: Cardiothoracic Surgery

## 2011-02-28 VITALS — BP 93/68 | HR 84 | Resp 18 | Ht 66.0 in | Wt 182.0 lb

## 2011-02-28 DIAGNOSIS — I2510 Atherosclerotic heart disease of native coronary artery without angina pectoris: Secondary | ICD-10-CM

## 2011-02-28 DIAGNOSIS — Z951 Presence of aortocoronary bypass graft: Secondary | ICD-10-CM

## 2011-02-28 NOTE — Patient Instructions (Signed)
You can drive,lift up to 15 lbs and start cardiac rehab.

## 2011-02-28 NOTE — Progress Notes (Signed)
HPI                   [address]            [place] [id number]  The patient returns for one month followup after undergoing emergency coronary artery bypass grafting x4 on September 28. The patient is 50 years old and presented to the EEG after an episode of ventricular fibrillation and cardiac arrest. Cardiac catheterization demonstrated 99% stenosis of left main and total occlusion right coronary. LVEF was reduced. He underwent left IMA grafting to the LAD and vein graft to the diagonal OM1 and OM 2. Postoperatively he did well. He was discharged home in sinus rhythm on a beta blocker, ACE inhibitor, aspirin, and a lipid agent. He continues to do well at home. He is walking about 0.8 miles daily. He has no recurrent angina or symptoms of CHF. The surgical incisions are healing well.             Current Outpatient Prescriptions  Medication Sig Dispense Refill  . aspirin 81 MG tablet Take 81 mg by mouth daily.        . clopidogrel (PLAVIX) 75 MG tablet Take 75 mg by mouth daily.        Marland Kitchen lisinopril (PRINIVIL,ZESTRIL) 5 MG tablet Take 10 mg by mouth daily. 1/2 tab po every day      . metoprolol tartrate (LOPRESSOR) 25 MG tablet Take 25 mg by mouth 2 (two) times daily.        Marland Kitchen oxycodone (OXY-IR) 5 MG capsule Take 5 mg by mouth every 4 (four) hours as needed.        . simvastatin (ZOCOR) 40 MG tablet Take 40 mg by mouth at bedtime.        . traMADol (ULTRAM) 50 MG tablet Take 50 mg by mouth every 6 (six) hours as needed.           Review of Systems: The patient reports difficulty sleeping. His appetite is reduced and he has lost 10 pounds from preop weight.  Physical Exam Vital signs blood pressure 95/60 pulse 80 sinus saturation room air 95% he is afebrile. Chest is without tenderness. The sternal incision is well-healed. Breath sounds are clear bilaterally. Cardiac rhythm is regular. There is no gallop or murmur. Extremities reveal no edema. The vein harvest site is  healed.   Diagnostic Tests: Chest x-ray today reveals clear lung fields, cardiac silhouette stable. Sternal wires intact.    Impression: Stable course 3/2 weeks following emergency CABG x4. He'll continue his current medications and return for a final evaluation approximately one month. He knows he can resume driving, he knows he can lift up to 15 pounds, and he is ready to start cardiac rehabilitation after he checks and with his cardiologist.   Plan: Return in one month.
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
## 2011-03-21 ENCOUNTER — Ambulatory Visit (INDEPENDENT_AMBULATORY_CARE_PROVIDER_SITE_OTHER): Payer: Self-pay | Admitting: Cardiothoracic Surgery

## 2011-03-21 ENCOUNTER — Encounter: Payer: Self-pay | Admitting: Cardiothoracic Surgery

## 2011-03-21 VITALS — BP 103/69 | HR 92 | Resp 16 | Ht 66.0 in | Wt 182.9 lb

## 2011-03-21 DIAGNOSIS — Z951 Presence of aortocoronary bypass graft: Secondary | ICD-10-CM

## 2011-03-21 NOTE — Progress Notes (Signed)
HPI: The patient returns for a 2 month followup after emergency CABG x4 when he presented with acute MI in shock. A balloon pump was placed in the cath lab. Patient underwent left IMA grafting to the LAD and vein grafts to the diagonal OM1 and OM 2. He is now [place] at home, increasing his exercise intensity, and experiencing no recurrent angina. The surgical incisions are well-healed.  Current Outpatient Prescriptions  Medication Sig Dispense Refill  . aspirin 81 MG tablet Take 81 mg by mouth daily.        Marland Kitchen atorvastatin (LIPITOR) 40 MG tablet Take 40 mg by mouth daily.        Marland Kitchen lisinopril (PRINIVIL,ZESTRIL) 5 MG tablet Take 10 mg by mouth daily. 1/2 tab po every day      . metoprolol tartrate (LOPRESSOR) 25 MG tablet Take 25 mg by mouth 2 (two) times daily.        . traMADol (ULTRAM) 50 MG tablet Take 50 mg by mouth every 6 (six) hours as needed.        . clopidogrel (PLAVIX) 75 MG tablet Take 75 mg by mouth daily.        Marland Kitchen oxycodone (OXY-IR) 5 MG capsule Take 5 mg by mouth every 4 (four) hours as needed.        . simvastatin (ZOCOR) 40 MG tablet Take 40 mg by mouth at bedtime.           Physical Exam: Vital signs stable blood pressure 110/60 pulse 80 and regular saturation 98%. Sounds are clear. Cardiac rhythm is regular gallop. There is no pedal edema. Diagnostic Tests: Chest x-ray not performed today. Last chest x-ray clear.  Impression: Stable course following emergency CABG. He is ready to return to work. He'll continue his current medications. He'll return here as needed.  Plan: Return when necessary                       [address]            [place] [address]          [phone]

## 2011-04-02 ENCOUNTER — Encounter: Payer: Self-pay | Admitting: Cardiovascular Disease

## 2011-04-05 ENCOUNTER — Other Ambulatory Visit: Payer: BC Managed Care – PPO | Admitting: *Deleted

## 2011-04-05 ENCOUNTER — Ambulatory Visit (INDEPENDENT_AMBULATORY_CARE_PROVIDER_SITE_OTHER): Payer: BC Managed Care – PPO | Admitting: Cardiovascular Disease

## 2011-04-05 ENCOUNTER — Encounter: Payer: Self-pay | Admitting: Cardiovascular Disease

## 2011-04-05 VITALS — BP 87/64 | HR 60 | Ht 66.0 in | Wt 191.0 lb

## 2011-04-05 DIAGNOSIS — E785 Hyperlipidemia, unspecified: Secondary | ICD-10-CM

## 2011-04-05 DIAGNOSIS — I2510 Atherosclerotic heart disease of native coronary artery without angina pectoris: Secondary | ICD-10-CM

## 2011-04-05 MED ORDER — TRAMADOL HCL 50 MG PO TABS: 50.0000 mg | ORAL_TABLET | Freq: Two times a day (BID) | ORAL | Status: DC | PRN

## 2011-04-05 MED ORDER — LISINOPRIL 5 MG PO TABS: 5.0000 mg | ORAL_TABLET | Freq: Every day | ORAL | Status: DC

## 2011-04-05 MED ORDER — METOPROLOL TARTRATE 25 MG PO TABS: 12.5000 mg | ORAL_TABLET | Freq: Two times a day (BID) | ORAL | Status: DC

## 2011-04-05 NOTE — Assessment & Plan Note (Addendum)
Stable post CABG. He is slightly hypotensive. Will lower Lopressor to 12. 5 mg po BID. Continue ASA, Lisinopril, statin. He is in cardiac rehab.

## 2011-04-05 NOTE — Assessment & Plan Note (Signed)
On statin. He had recent lipids in primary care this week. Will need f/u LFTs also if not done in primary care.

## 2011-04-05 NOTE — Progress Notes (Signed)
History of Present Illness: [name] is a 50 y.o. male who presents for cardiac follow up. He was admitted 9/28-10/3/12 with an inferolateral ST elevation myocardial infarction. He suffered V. Fib arrest in the emergency room and required defibrillation x2 and intubation. Cardiac catheterization 02/02/11: dLM 70-80%, oLAD 90%, pCFX occluded with L-R collats, EF 40% with inf HK. He underwent salvage PCI with thrombectomy and balloon angioplasty of the occluded circumflex. He was then taken for emergent bypass. Grafts included LIMA to LAD, SVG to Diagonal, SVG to OM1&OM2. (Dr. Donata Clay). His postoperative course was uneventful. He remained in sinus rhythm. Plavix was added post op due to presentation with MI and PCI. Echocardiogram 02/06/11: Mild LVH, EF 65%, mild MR, mild RVE, mildly reduced RVSF. He was seen in our office 02/23/11 by Tereso Newcomer, PA-C.   He tells me today that he has been doing well. He has some pain over his sternal wound but no exertional pain. His breathing is ok. No lower ext edema, orthopnea, PND.    Past Medical History  Diagnosis Date  . Obesity   . CAD (coronary artery disease)     IL STEMI 9/12:  Cardiac catheterization 02/02/11: dLM 70-80%, oLAD 90%, pCFX occluded with L-R collats, EF 40% with inf HK.  He underwent salvage PCI with thrombectomy and balloon angioplasty of the occluded circumflex.  He was then taken for emergent bypass.  Grafts included L-LAD, S-Dx, S-OM1&OM2  . HLD (hyperlipidemia)   . HTN (hypertension)     Past Surgical History  Procedure Date  . Cardiac catheterization 02/02/11  . Coronary artery bypass graft 02/03/11    Current Outpatient Prescriptions  Medication Sig Dispense Refill  . aspirin 81 MG tablet Take 81 mg by mouth daily.        Marland Kitchen atorvastatin (LIPITOR) 40 MG tablet Take 40 mg by mouth daily.        Marland Kitchen lisinopril (PRINIVIL,ZESTRIL) 10 MG tablet 1/2 tab daily       . metoprolol tartrate (LOPRESSOR) 25 MG tablet Take 25 mg by mouth  2 (two) times daily.        . traMADol (ULTRAM) 50 MG tablet 1 tab twice a day        No Known Allergies  History   Social History  . Marital Status: Married    Spouse Name: N/A    Number of Children: N/A  . Years of Education: N/A   Occupational History  . Information Technology    Social History Main Topics  . Smoking status: Former Smoker    Quit date: 05/07/1998  . Smokeless tobacco: Not on file  . Alcohol Use: Not on file  . Drug Use: Not on file  . Sexually Active: Not on file   Other Topics Concern  . Not on file   Social History Narrative  . No narrative on file    Family History  Problem Relation Age of Onset  . Diabetes      Review of Systems:  As stated in the HPI and otherwise negative.   BP 87/64  Pulse 60  Ht 5\' 6"  (1.676 m)  Wt 191 lb (86.637 kg)  BMI 30.83 kg/m2  Physical Examination: General: Well developed, well nourished, NAD HEENT: OP clear, mucus membranes moist SKIN: warm, dry. No rashes. Neuro: No focal deficits Musculoskeletal: Muscle strength 5/5 all ext Psychiatric: Mood and affect normal Neck: No JVD, no carotid bruits, no thyromegaly, no lymphadenopathy. Lungs:Clear bilaterally, no wheezes, rhonci, crackles Cardiovascular: Regular  rate and rhythm. No murmurs, gallops or rubs. Abdomen:Soft. Bowel sounds present. Non-tender.  Extremities: No lower extremity edema. Pulses are 2 + in the bilateral DP/PT.

## 2011-04-05 NOTE — Patient Instructions (Signed)
Your physician recommends that you schedule a follow-up appointment in: 6 months.   Your physician has recommended you make the following change in your medication: Decrease metoprolol to 12.5 mg by mouth twice daily.

## 2011-10-02 ENCOUNTER — Ambulatory Visit (INDEPENDENT_AMBULATORY_CARE_PROVIDER_SITE_OTHER): Payer: BC Managed Care – PPO | Admitting: Cardiovascular Disease

## 2011-10-02 ENCOUNTER — Encounter: Payer: Self-pay | Admitting: Cardiovascular Disease

## 2011-10-02 VITALS — BP 116/60 | HR 94 | Ht 66.0 in | Wt 198.1 lb

## 2011-10-02 DIAGNOSIS — I2510 Atherosclerotic heart disease of native coronary artery without angina pectoris: Secondary | ICD-10-CM

## 2011-10-02 NOTE — Patient Instructions (Signed)
Your physician wants you to follow-up in:  6 months. You will receive a reminder letter in the mail two months in advance. If you don't receive a letter, please call our office to schedule the follow-up appointment.   

## 2011-10-02 NOTE — Progress Notes (Signed)
History of Present Illness: [name] is a 51 y.o. male who presents for cardiac follow up. He was admitted 9/28-10/3/12 with an inferolateral ST elevation myocardial infarction. He suffered V. Fib arrest in the emergency room and required defibrillation x2 and intubation. Cardiac catheterization 02/02/11: distal LM 70-80%, ostial LAD 90%, proximal Circumflex occluded with L-R collats, EF 40% with inf HK. He underwent salvage PCI with thrombectomy and balloon angioplasty of the occluded circumflex. He was then taken for emergent bypass. Grafts included LIMA to LAD, SVG to Diagonal, SVG to OM1&OM2. (Dr. Donata Clay). His postoperative course was uneventful. Plavix was added post op due to presentation with MI and PCI. Echocardiogram 02/06/11: Mild LVH, EF 65%, mild MR, mild RVE, mildly reduced RVSF. He was last seen in our office in November 2012.    He tells me today that he has been doing well. No chest pains.  His breathing is ok. No lower ext edema, orthopnea, PND. He is exercising several times per week on the treadmill, 40 minutes each time.    Primary Care Physician: Andi Devon  Last Lipid Profile:  Followed in primary care. November 2012: Total chol: 133  HDL: 44 LDL: 47   Past Medical History  Diagnosis Date  . Obesity   . CAD (coronary artery disease)     IL STEMI 9/12:  Cardiac catheterization 02/02/11: dLM 70-80%, oLAD 90%, pCFX occluded with L-R collats, EF 40% with inf HK.  He underwent salvage PCI with thrombectomy and balloon angioplasty of the occluded circumflex.  He was then taken for emergent bypass.  Grafts included L-LAD, S-Dx, S-OM1&OM2  . HLD (hyperlipidemia)   . HTN (hypertension)     Past Surgical History  Procedure Date  . Cardiac catheterization 02/02/11  . Coronary artery bypass graft 02/03/11    Current Outpatient Prescriptions  Medication Sig Dispense Refill  . Ascorbic Acid (VITAMIN C) 1000 MG tablet Take 1,000 mg by mouth daily.      Marland Kitchen aspirin 81 MG  tablet Take 81 mg by mouth daily.        Marland Kitchen atorvastatin (LIPITOR) 40 MG tablet Take 40 mg by mouth daily.        . Cholecalciferol (VITAMIN D-3 PO) Take 2,000 Units by mouth daily.      . Coenzyme Q10 (CO Q 10 PO) Take 300 mg by mouth daily.      Marland Kitchen lisinopril (PRINIVIL,ZESTRIL) 5 MG tablet Take 1 tablet (5 mg total) by mouth daily.  30 tablet  11  . metoprolol tartrate (LOPRESSOR) 25 MG tablet Take 0.5 tablets (12.5 mg total) by mouth 2 (two) times daily.  30 tablet  11  . traMADol (ULTRAM) 50 MG tablet Take 1 tablet (50 mg total) by mouth 2 (two) times daily as needed for pain. 1 tab twice a day  60 tablet  0    No Known Allergies  History   Social History  . Marital Status: Married    Spouse Name: N/A    Number of Children: N/A  . Years of Education: N/A   Occupational History  . Information Technology    Social History Main Topics  . Smoking status: Former Smoker    Quit date: 05/07/1998  . Smokeless tobacco: Not on file  . Alcohol Use: Not on file  . Drug Use: Not on file  . Sexually Active: Not on file   Other Topics Concern  . Not on file   Social History Narrative  . No narrative on  file    Family History  Problem Relation Age of Onset  . Diabetes      Review of Systems:  As stated in the HPI and otherwise negative.   BP 116/60  Pulse 94  Ht 5\' 6"  (1.676 m)  Wt 198 lb 1.9 oz (89.867 kg)  BMI 31.98 kg/m2  Physical Examination: General: Well developed, well nourished, NAD HEENT: OP clear, mucus membranes moist SKIN: warm, dry. No rashes. Neuro: No focal deficits Musculoskeletal: Muscle strength 5/5 all ext Psychiatric: Mood and affect normal Neck: No JVD, no carotid bruits, no thyromegaly, no lymphadenopathy. Lungs:Clear bilaterally, no wheezes, rhonci, crackles Cardiovascular: Regular rate and rhythm. No murmurs, gallops or rubs. Abdomen:Soft. Bowel sounds present. Non-tender.  Extremities: No lower extremity edema. Pulses are 2 + in the bilateral  DP/PT.

## 2011-10-02 NOTE — Assessment & Plan Note (Addendum)
Stable. He is doing well. No chest pain. He is very active. He is on good medical therapy. BP is well controlled. Lipids are well controlled.

## 2012-03-12 ENCOUNTER — Ambulatory Visit: Payer: BC Managed Care – PPO | Admitting: Cardiovascular Disease

## 2012-03-13 ENCOUNTER — Encounter: Payer: Self-pay | Admitting: Cardiovascular Disease

## 2012-03-13 ENCOUNTER — Ambulatory Visit (INDEPENDENT_AMBULATORY_CARE_PROVIDER_SITE_OTHER): Payer: BC Managed Care – PPO | Admitting: Cardiovascular Disease

## 2012-03-13 VITALS — BP 127/74 | HR 84 | Ht 66.0 in | Wt 209.0 lb

## 2012-03-13 DIAGNOSIS — I25810 Atherosclerosis of coronary artery bypass graft(s) without angina pectoris: Secondary | ICD-10-CM

## 2012-03-13 DIAGNOSIS — I2510 Atherosclerotic heart disease of native coronary artery without angina pectoris: Secondary | ICD-10-CM

## 2012-03-13 DIAGNOSIS — I10 Essential (primary) hypertension: Secondary | ICD-10-CM

## 2012-03-13 MED ORDER — METOPROLOL TARTRATE 25 MG PO TABS: 12.5000 mg | ORAL_TABLET | Freq: Two times a day (BID) | ORAL | Status: DC

## 2012-03-13 MED ORDER — LISINOPRIL 5 MG PO TABS: 5.0000 mg | ORAL_TABLET | Freq: Every day | ORAL | Status: DC

## 2012-03-13 NOTE — Progress Notes (Signed)
History of Present Illness: [name] is a 51 y.o. male with history of CAD, HTN, HLD here today for cardiac followup. He was admitted 9/28-10/3/12 with an inferolateral ST elevation myocardial infarction. He suffered V. Fib arrest in the emergency room and required defibrillation x2 and intubation. Cardiac catheterization 02/02/11: distal LM 70-80%, ostial LAD 90%, proximal Circumflex occluded with L-R collats, EF 40% with inf HK. He underwent salvage PCI with thrombectomy and balloon angioplasty of the occluded circumflex. He was then taken for emergent bypass. Grafts included LIMA to LAD, SVG to Diagonal, SVG to OM1&OM2. (Dr. Donata Clay). His postoperative course was uneventful. Plavix was added post op due to presentation with MI and PCI. Echocardiogram 02/06/11: Mild LVH, EF 65%, mild MR, mild RVE, mildly reduced RVSF. He was last seen in our office in May 2013    He tells me today that he has been doing well. No chest pains. His breathing is ok. No lower ext edema, orthopnea, PND. He is exercising several times per week on the treadmill.   Primary Care Physician: Andi Devon   Last Lipid Profile: Followed in primary care.  Past Medical History  Diagnosis Date  . Obesity   . CAD (coronary artery disease)     IL STEMI 9/12:  Cardiac catheterization 02/02/11: dLM 70-80%, oLAD 90%, pCFX occluded with L-R collats, EF 40% with inf HK.  He underwent salvage PCI with thrombectomy and balloon angioplasty of the occluded circumflex.  He was then taken for emergent bypass.  Grafts included L-LAD, S-Dx, S-OM1&OM2  . HLD (hyperlipidemia)   . HTN (hypertension)     Past Surgical History  Procedure Date  . Cardiac catheterization 02/02/11  . Coronary artery bypass graft 02/03/11  . Wisdom tooth extraction   . Dental surgery on tooth that did not descend     Current Outpatient Prescriptions  Medication Sig Dispense Refill  . Ascorbic Acid (VITAMIN C) 1000 MG tablet Take 1,000 mg by mouth  daily.      Marland Kitchen aspirin 81 MG tablet Take 81 mg by mouth daily.        Marland Kitchen atorvastatin (LIPITOR) 40 MG tablet Take 40 mg by mouth daily.        . Cholecalciferol (VITAMIN D-3 PO) Take 2,000 Units by mouth daily.      . Coenzyme Q10 (CO Q 10 PO) Take 300 mg by mouth daily.      Marland Kitchen lisinopril (PRINIVIL,ZESTRIL) 5 MG tablet Take 1 tablet (5 mg total) by mouth daily.  30 tablet  11  . metoprolol tartrate (LOPRESSOR) 25 MG tablet Take 0.5 tablets (12.5 mg total) by mouth 2 (two) times daily.  30 tablet  11    No Known Allergies  History   Social History  . Marital Status: Married    Spouse Name: N/A    Number of Children: 0  . Years of Education: N/A   Occupational History  . Information Technology   .     Social History Main Topics  . Smoking status: Former Smoker -- 1.0 packs/day for 22 years    Types: Cigarettes    Quit date: 05/07/1997  . Smokeless tobacco: Not on file  . Alcohol Use: 0.5 oz/week    1 drink(s) per week  . Drug Use: No  . Sexually Active: Not on file   Other Topics Concern  . Not on file   Social History Narrative  . No narrative on file    Family History  Problem Relation  Age of Onset  . Cancer Father   . Breast cancer Mother     Review of Systems:  As stated in the HPI and otherwise negative.   BP 127/74  Pulse 84  Ht 5\' 6"  (1.676 m)  Wt 209 lb (94.802 kg)  BMI 33.73 kg/m2  Physical Examination: General: Well developed, well nourished, NAD HEENT: OP clear, mucus membranes moist SKIN: warm, dry. No rashes. Neuro: No focal deficits Musculoskeletal: Muscle strength 5/5 all ext Psychiatric: Mood and affect normal Neck: No JVD, no carotid bruits, no thyromegaly, no lymphadenopathy. Lungs:Clear bilaterally, no wheezes, rhonci, crackles Cardiovascular: Regular rate and rhythm. No murmurs, gallops or rubs. Abdomen:Soft. Bowel sounds present. Non-tender.  Extremities: No lower extremity edema. Pulses are 2 + in the bilateral DP/PT.  Assessment  and Plan:   1. CAD: Stable. He is doing well. No chest pain. He is very active. He is on good medical therapy. BP is well controlled. Lipids are well controlled. Will arrange f/u treadmill stress test when he returns in 6 months.   2. HTN: BP well controlled. No changes

## 2012-03-13 NOTE — Patient Instructions (Signed)
Your physician wants you to follow-up in:  6 months. You will receive a reminder letter in the mail two months in advance. If you don't receive a letter, please call our office to schedule the follow-up appointment.   

## 2012-04-05 ENCOUNTER — Observation Stay: Payer: Self-pay | Admitting: Internal Medicine

## 2012-04-05 LAB — CBC
HCT: 49.8 % (ref 40.0–52.0)
HGB: 17.4 g/dL (ref 13.0–18.0)
MCH: 31.6 pg (ref 26.0–34.0)
MCHC: 34.8 g/dL (ref 32.0–36.0)
RBC: 5.49 10*6/uL (ref 4.40–5.90)
WBC: 11.9 10*3/uL — ABNORMAL HIGH (ref 3.8–10.6)

## 2012-04-05 LAB — CK TOTAL AND CKMB (NOT AT ARMC)
CK, Total: 155 U/L (ref 35–232)
CK, Total: 124 U/L (ref 35–232)
CK-MB: 0.8 ng/mL (ref 0.5–3.6)

## 2012-04-05 LAB — COMPREHENSIVE METABOLIC PANEL
Albumin: 4.5 g/dL (ref 3.4–5.0)
Anion Gap: 8 (ref 7–16)
BUN: 10 mg/dL (ref 7–18)
Co2: 25 mmol/L (ref 21–32)
Creatinine: 0.89 mg/dL (ref 0.60–1.30)
EGFR (African American): >60
Glucose: 95 mg/dL (ref 65–99)
Osmolality: 267 (ref 275–301)
SGOT(AST): 39 U/L — ABNORMAL HIGH (ref 15–37)
SGPT (ALT): 53 U/L (ref 12–78)
Sodium: 134 mmol/L — ABNORMAL LOW (ref 136–145)

## 2012-04-06 DIAGNOSIS — I200 Unstable angina: Secondary | ICD-10-CM

## 2012-04-06 LAB — URINALYSIS, COMPLETE
Blood: NEGATIVE
Glucose,UR: NEGATIVE mg/dL (ref 0–75)
Leukocyte Esterase: NEGATIVE
Ph: 6 (ref 4.5–8.0)
Protein: NEGATIVE
RBC,UR: <1 /HPF (ref 0–5)
Squamous Epithelial: <1

## 2012-04-06 LAB — CBC WITH DIFFERENTIAL/PLATELET
Basophil #: 0.1 10*3/uL (ref 0.0–0.1)
Eosinophil #: 0.2 10*3/uL (ref 0.0–0.7)
HGB: 16 g/dL (ref 13.0–18.0)
Lymphocyte %: 28.2 %
MCH: 31.6 pg (ref 26.0–34.0)
MCHC: 34.6 g/dL (ref 32.0–36.0)
MCV: 91 fL (ref 80–100)
Monocyte #: 1.3 x10 3/mm — ABNORMAL HIGH (ref 0.2–1.0)
Neutrophil #: 6.7 10*3/uL — ABNORMAL HIGH (ref 1.4–6.5)
Neutrophil %: 58 %
Platelet: 304 10*3/uL (ref 150–440)
WBC: 11.6 10*3/uL — ABNORMAL HIGH (ref 3.8–10.6)

## 2012-04-06 LAB — LIPID PANEL
Cholesterol: 143 mg/dL (ref 0–200)
HDL Cholesterol: 36 mg/dL — ABNORMAL LOW (ref 40–60)
Triglycerides: 145 mg/dL (ref 0–200)

## 2012-04-06 LAB — CK TOTAL AND CKMB (NOT AT ARMC)
CK, Total: 102 U/L (ref 35–232)
CK-MB: <0.5 ng/mL — ABNORMAL LOW (ref 0.5–3.6)
CK-MB: 0.7 ng/mL (ref 0.5–3.6)

## 2012-04-06 LAB — TROPONIN I: Troponin-I: <0.02 ng/mL

## 2012-04-07 DIAGNOSIS — I517 Cardiomegaly: Secondary | ICD-10-CM

## 2012-04-07 DIAGNOSIS — R0602 Shortness of breath: Secondary | ICD-10-CM

## 2012-04-11 ENCOUNTER — Other Ambulatory Visit: Payer: Self-pay | Admitting: Cardiovascular Disease

## 2012-05-15 IMAGING — CR DG CHEST 2V
2 series · 2 of 2 positions shown · non-contrast
Comparison: PA and lateral chest 02/06/2011.

CLINICAL DATA: Chest pain and shortness of breath.

CHEST - 2 VIEW

[w chest pa]
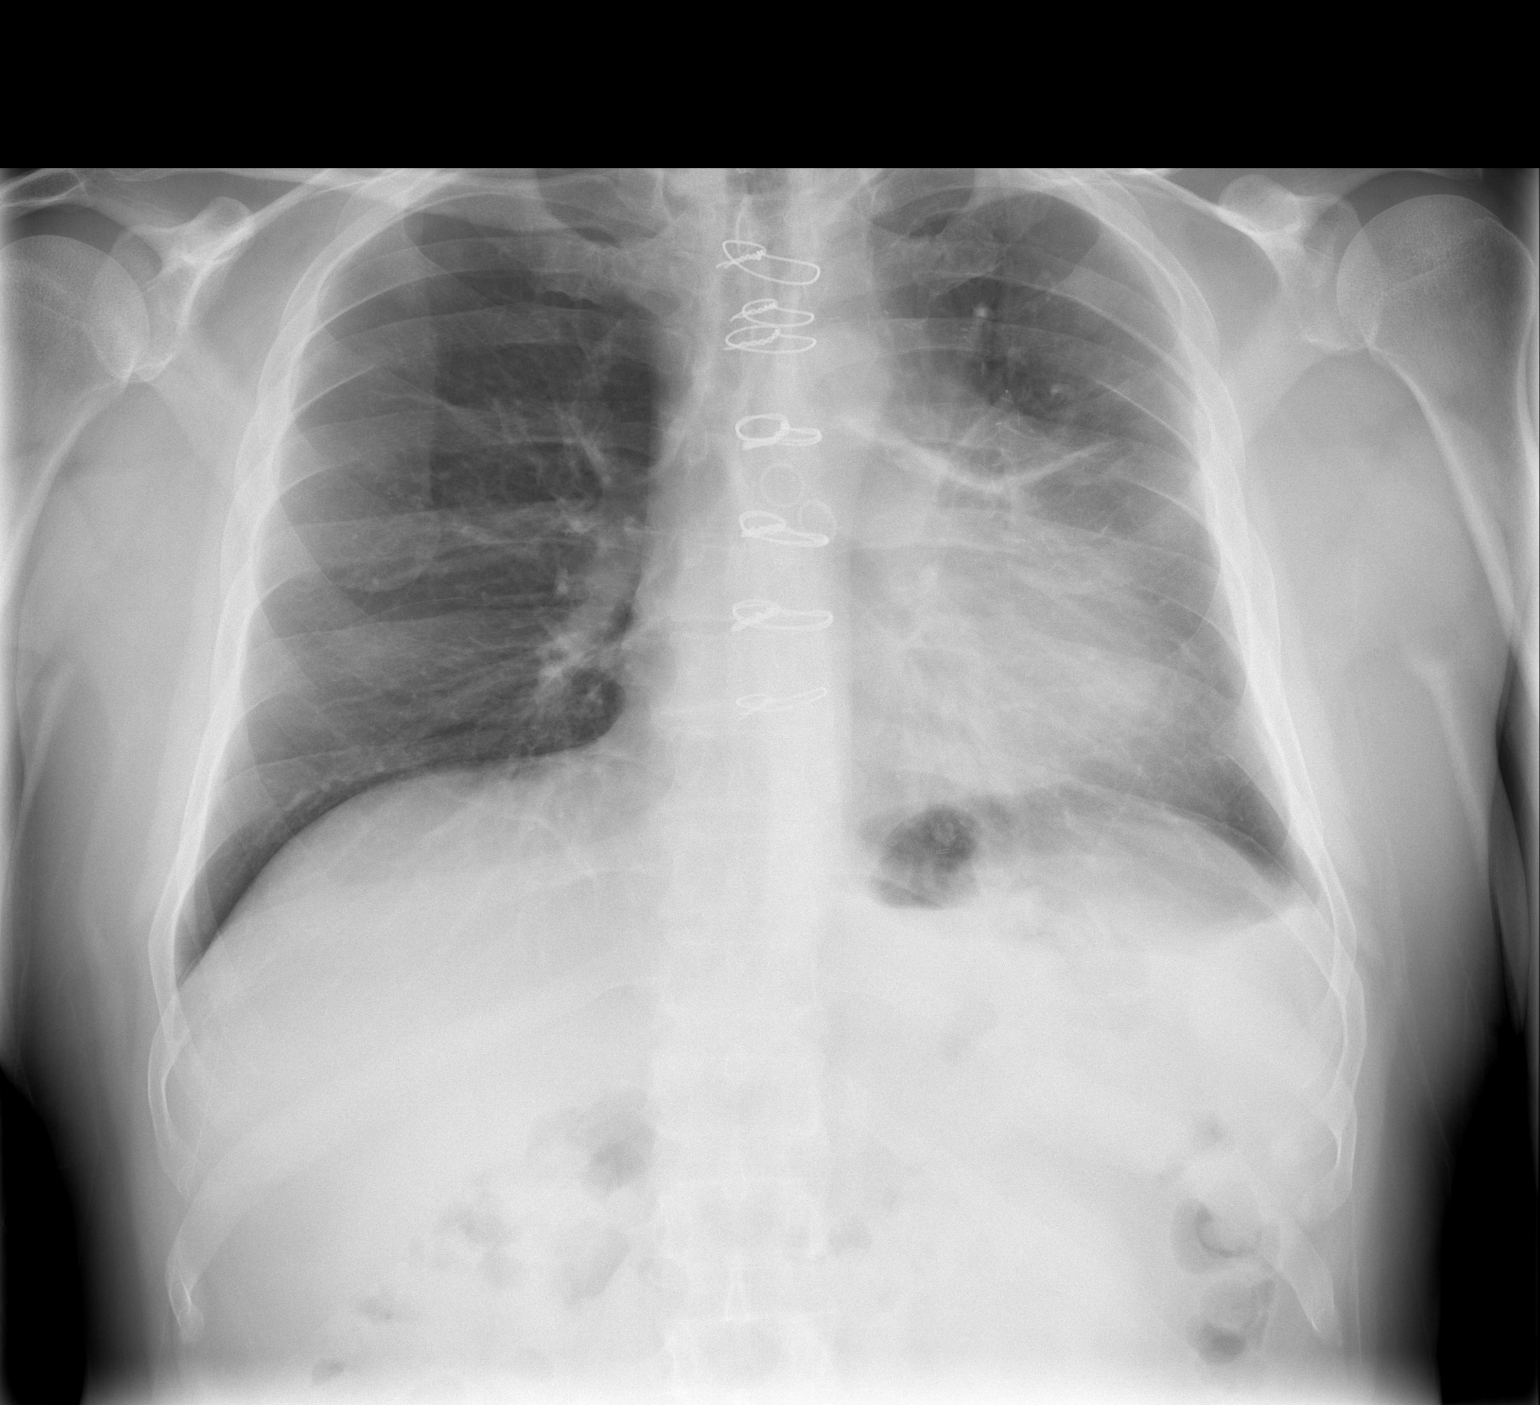

[w chest lat]
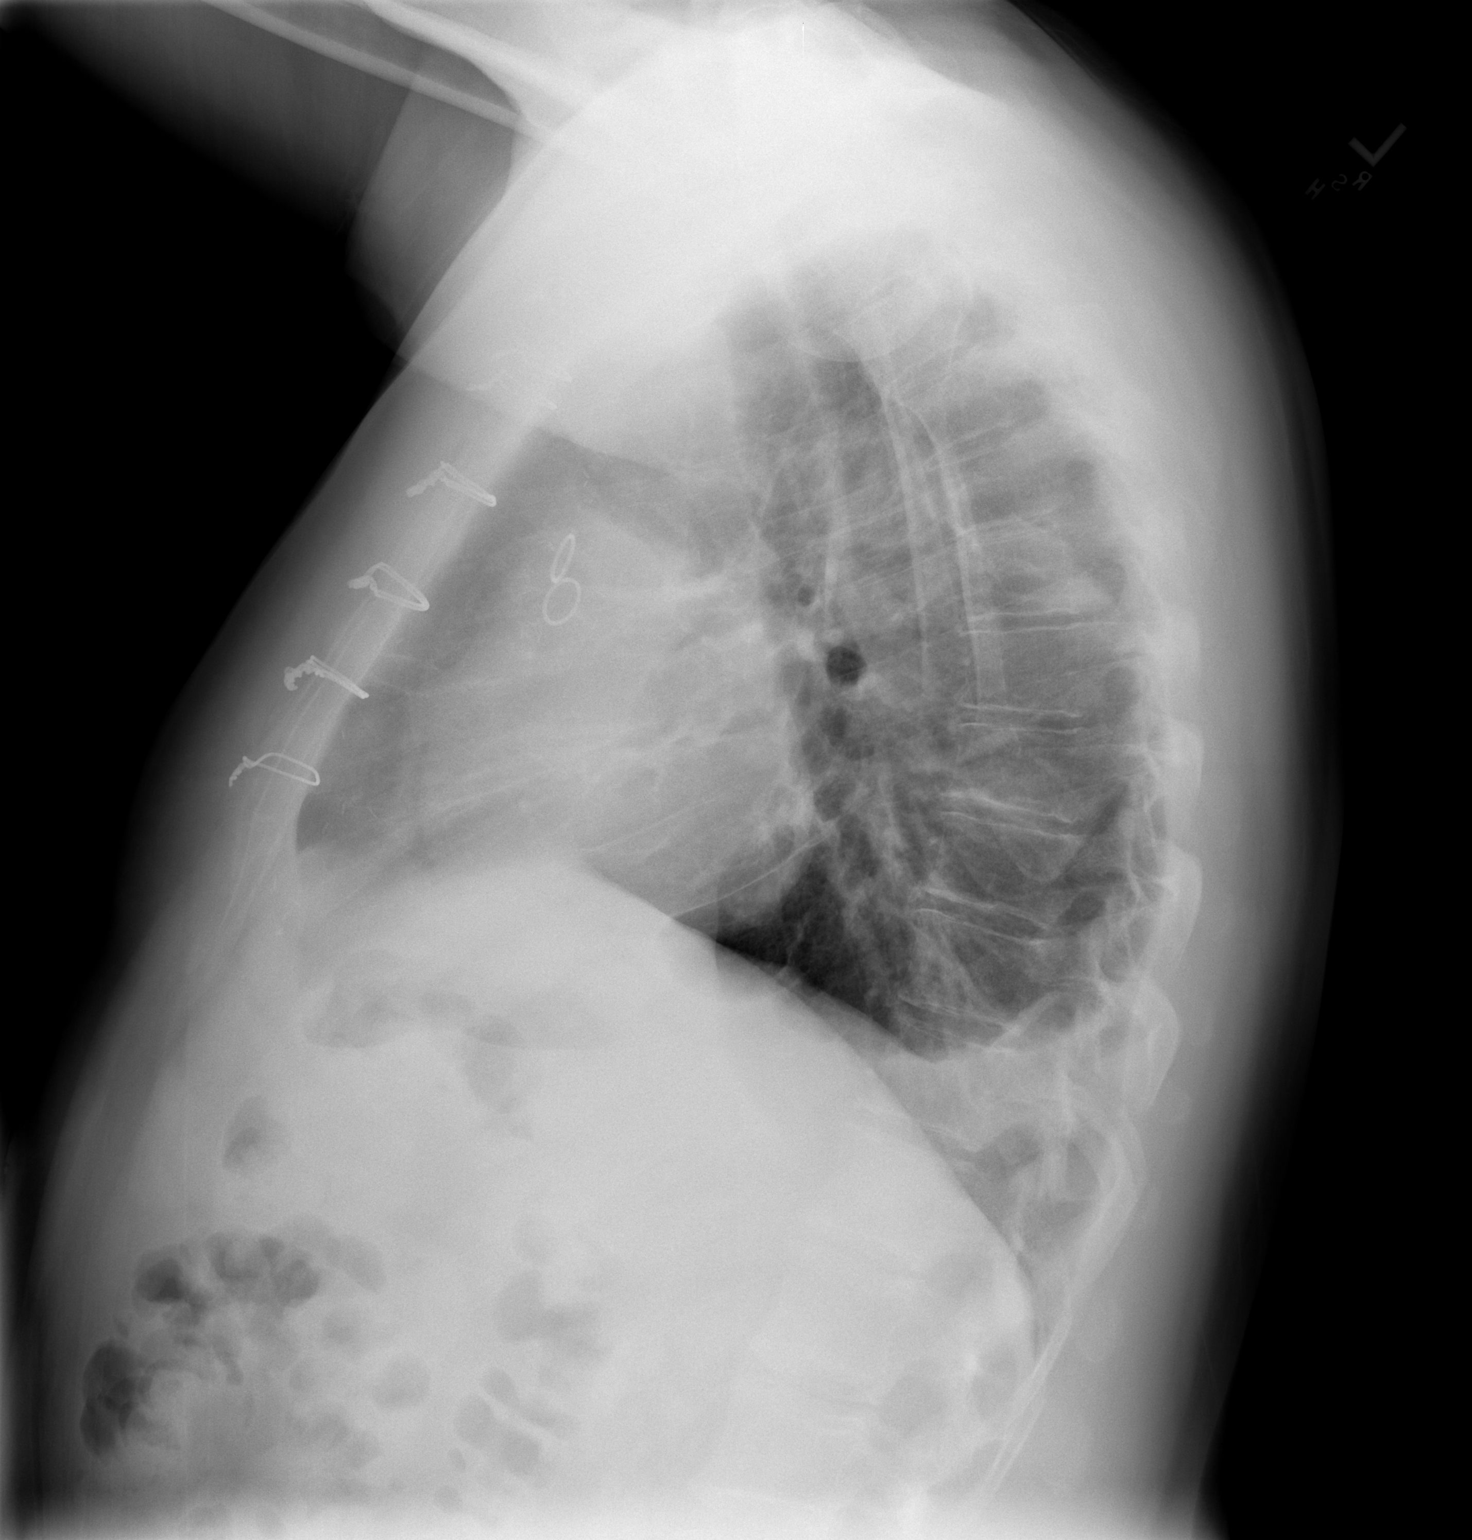

[2 of 2 positions shown; findings below may reference images not displayed]

FINDINGS: The patient is status post CABG.  There is a small left
pleural effusion.  No right effusion is identified.  Scattered,
mild atelectasis on the left is noted. Right basilar atelectasis
has cleared.  Heart size is normal.
IMPRESSION: Small left effusion. Right effusion and basilar atelectasis have
cleared.

## 2012-09-04 ENCOUNTER — Encounter: Payer: Self-pay | Admitting: Cardiovascular Disease

## 2012-09-04 ENCOUNTER — Ambulatory Visit (INDEPENDENT_AMBULATORY_CARE_PROVIDER_SITE_OTHER): Payer: BC Managed Care – PPO | Admitting: Cardiovascular Disease

## 2012-09-04 VITALS — BP 112/70 | HR 78 | Ht 66.0 in | Wt 195.0 lb

## 2012-09-04 DIAGNOSIS — I25810 Atherosclerosis of coronary artery bypass graft(s) without angina pectoris: Secondary | ICD-10-CM

## 2012-09-04 NOTE — Progress Notes (Signed)
History of Present Illness: [name] is a 52 y.o. male with history of CAD, HTN, HLD here today for cardiac followup. He was admitted 9/28-10/3/12 with an inferolateral ST elevation myocardial infarction. He suffered V. Fib arrest in the emergency room and required defibrillation x2 and intubation. Cardiac catheterization 02/02/11: distal LM 70-80%, ostial LAD 90%, proximal Circumflex occluded with L-R collats, EF 40% with inf HK. He underwent salvage PCI with thrombectomy and balloon angioplasty of the occluded circumflex. He was then taken for emergent bypass. Grafts included LIMA to LAD, SVG to Diagonal, SVG to OM1&OM2. (Dr. Donata Clay). His postoperative course was uneventful. Plavix was added post op due to presentation with MI and PCI. Echocardiogram 02/06/11: Mild LVH, EF 65%, mild MR, mild RVE, mildly reduced RVSF. He was last seen in our office in November 2013. At the end of November he felt weak and dizzy. He was seen in the [facility] ED and was admitted. He underwent a stress myoview at [facility] November 2013 and there was no ischemia. His LVEF was 65%. His Lopressor was changed to 25 mg po at night instead of 12.5 mg po BID.   He tells me today that he has been doing well. No chest pains. His breathing is ok. No lower ext edema, orthopnea, PND. He is exercising several times per week on the treadmill.   Primary Care Physician: Andi Devon   Last Lipid Profile: Followed in primary care.  Past Medical History  Diagnosis Date  . Obesity   . CAD (coronary artery disease)     IL STEMI 9/12:  Cardiac catheterization 02/02/11: dLM 70-80%, oLAD 90%, pCFX occluded with L-R collats, EF 40% with inf HK.  He underwent salvage PCI with thrombectomy and balloon angioplasty of the occluded circumflex.  He was then taken for emergent bypass.  Grafts included L-LAD, S-Dx, S-OM1&OM2  . HLD (hyperlipidemia)   . HTN (hypertension)     Past Surgical History  Procedure Laterality Date  . Cardiac  catheterization  02/02/11  . Coronary artery bypass graft  02/03/11  . Wisdom tooth extraction    . Dental surgery on tooth that did not descend      Current Outpatient Prescriptions  Medication Sig Dispense Refill  . Ascorbic Acid (VITAMIN C) 1000 MG tablet Take 1,000 mg by mouth daily.      Marland Kitchen aspirin 81 MG tablet Take 81 mg by mouth daily.        Marland Kitchen atorvastatin (LIPITOR) 40 MG tablet Take 40 mg by mouth daily.        . Cholecalciferol (VITAMIN D-3 PO) Take 2,000 Units by mouth daily.      . Coenzyme Q10 (CO Q 10 PO) Take 300 mg by mouth daily.      Marland Kitchen lisinopril (PRINIVIL,ZESTRIL) 5 MG tablet Take 1 tablet (5 mg total) by mouth daily.  30 tablet  11  . metoprolol tartrate (LOPRESSOR) 25 MG tablet Take 0.5 tablets (12.5 mg total) by mouth 2 (two) times daily.  30 tablet  11   No current facility-administered medications for this visit.    No Known Allergies  History   Social History  . Marital Status: Married    Spouse Name: N/A    Number of Children: 0  . Years of Education: N/A   Occupational History  . Information Technology   .     Social History Main Topics  . Smoking status: Former Smoker -- 1.00 packs/day for 22 years    Types: Cigarettes  Quit date: 05/07/1997  . Smokeless tobacco: Not on file  . Alcohol Use: 0.5 oz/week    1 drink(s) per week  . Drug Use: No  . Sexually Active: Not on file   Other Topics Concern  . Not on file   Social History Narrative  . No narrative on file    Family History  Problem Relation Age of Onset  . Cancer Father   . Breast cancer Mother     Review of Systems:  As stated in the HPI and otherwise negative.   BP 112/70  Pulse 78  Ht 5\' 6"  (1.676 m)  Wt 195 lb (88.451 kg)  BMI 31.49 kg/m2  SpO2 98%  Physical Examination: General: Well developed, well nourished, NAD HEENT: OP clear, mucus membranes moist SKIN: warm, dry. No rashes. Neuro: No focal deficits Musculoskeletal: Muscle strength 5/5 all ext Psychiatric:  Mood and affect normal Neck: No JVD, no carotid bruits, no thyromegaly, no lymphadenopathy. Lungs:Clear bilaterally, no wheezes, rhonci, crackles Cardiovascular: Regular rate and rhythm. No murmurs, gallops or rubs. Abdomen:Soft. Bowel sounds present. Non-tender.  Extremities: No lower extremity edema. Pulses are 2 + in the bilateral DP/PT.  Assessment and Plan:   1. CAD: Stable. He is doing well. No chest pain. He is very active. He is on good medical therapy. BP is well controlled. Lipids are well controlled.   2. HTN: BP well controlled. Some orthostatic hypotension last month so will d/c Lisinopril and will change Lopressor back to 12.5 mg po BID.

## 2012-09-04 NOTE — Patient Instructions (Addendum)
Your physician wants you to follow-up in:  12 months. You will receive a reminder letter in the mail two months in advance. If you don't receive a letter, please call our office to schedule the follow-up appointment.  Your physician has recommended you make the following change in your medication: Stop lisinopril. Resume metoprolol 12. 5 mg by mouth twice daily

## 2013-04-14 ENCOUNTER — Other Ambulatory Visit: Payer: Self-pay

## 2013-04-14 DIAGNOSIS — I2510 Atherosclerotic heart disease of native coronary artery without angina pectoris: Secondary | ICD-10-CM

## 2013-04-14 MED ORDER — METOPROLOL TARTRATE 25 MG PO TABS: 12.5000 mg | ORAL_TABLET | Freq: Two times a day (BID) | ORAL | Status: DC

## 2013-04-23 ENCOUNTER — Other Ambulatory Visit: Payer: Self-pay | Admitting: *Deleted

## 2013-04-23 DIAGNOSIS — I2510 Atherosclerotic heart disease of native coronary artery without angina pectoris: Secondary | ICD-10-CM

## 2013-04-23 MED ORDER — METOPROLOL TARTRATE 25 MG PO TABS: 12.5000 mg | ORAL_TABLET | Freq: Two times a day (BID) | ORAL | Status: DC

## 2013-06-26 IMAGING — CR DG CHEST 1V PORT
1 series · 1 of 1 positions shown · non-contrast
Comparison: none

REASON FOR EXAM: cough
COMMENTS:

PROCEDURE:     DXR - DXR PORTABLE CHEST SINGLE VIEW  - April 05, 2012  [DATE]
RESULT:     The lungs are well-expanded. The cardiac silhouette is top
normal in size. The left heart border is somewhat obscured superiorly. The
central pulmonary vascularity is prominent.

[ap]
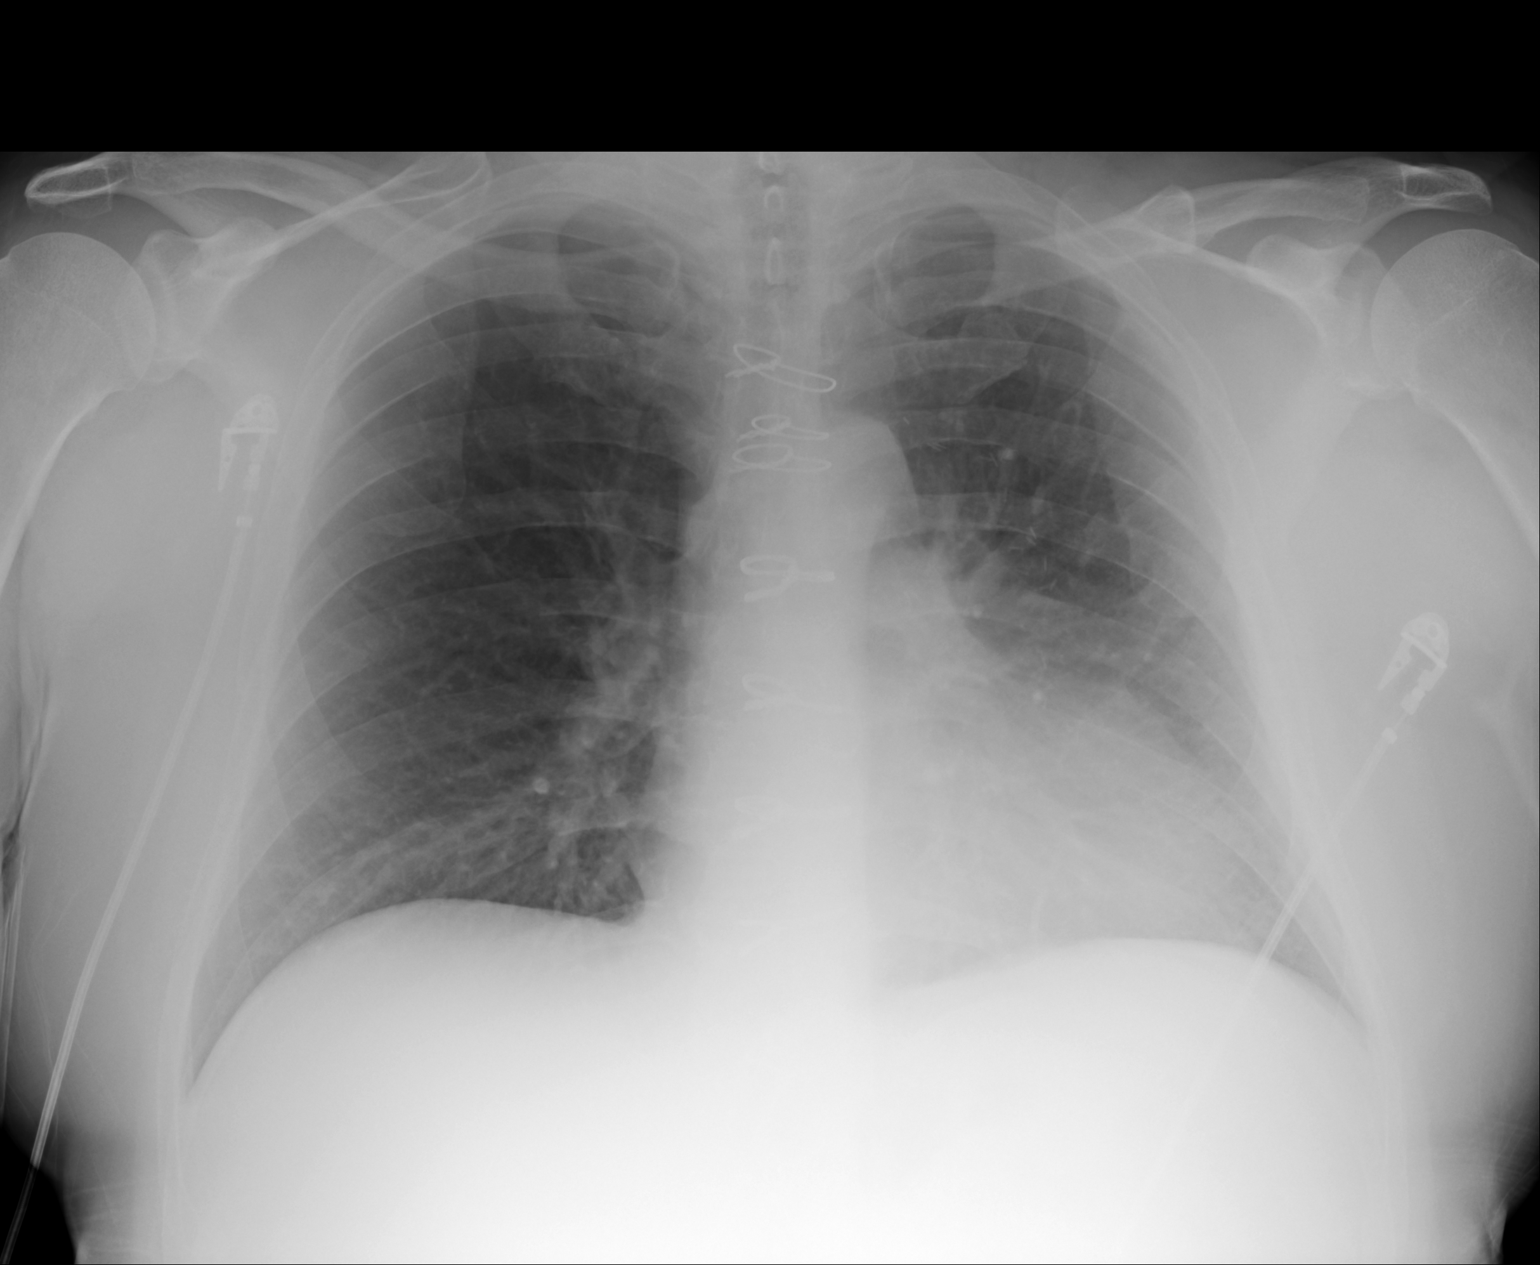

[1 of 1 positions shown; findings below may reference images not displayed]

IMPRESSION: I cannot exclude low-grade interstitial edema especially on
the left in the appropriate clinical setting. Alternatively subsegmental
atelectasis in the left perihilar region could be present. A followup PA and
lateral chest x-ray would be of value if the patient can tolerate the
procedure.

[REDACTED]

## 2013-09-04 ENCOUNTER — Encounter (INDEPENDENT_AMBULATORY_CARE_PROVIDER_SITE_OTHER): Payer: Self-pay

## 2013-09-04 ENCOUNTER — Encounter: Payer: Self-pay | Admitting: Cardiovascular Disease

## 2013-09-04 ENCOUNTER — Ambulatory Visit (INDEPENDENT_AMBULATORY_CARE_PROVIDER_SITE_OTHER): Payer: 59 | Admitting: Cardiovascular Disease

## 2013-09-04 VITALS — BP 122/72 | HR 80 | Ht 65.0 in | Wt 206.0 lb

## 2013-09-04 DIAGNOSIS — E785 Hyperlipidemia, unspecified: Secondary | ICD-10-CM

## 2013-09-04 DIAGNOSIS — I2510 Atherosclerotic heart disease of native coronary artery without angina pectoris: Secondary | ICD-10-CM

## 2013-09-04 DIAGNOSIS — I25810 Atherosclerosis of coronary artery bypass graft(s) without angina pectoris: Secondary | ICD-10-CM

## 2013-09-04 DIAGNOSIS — I10 Essential (primary) hypertension: Secondary | ICD-10-CM

## 2013-09-04 MED ORDER — METOPROLOL TARTRATE 25 MG PO TABS: 12.5000 mg | ORAL_TABLET | Freq: Two times a day (BID) | ORAL | Status: DC

## 2013-09-04 NOTE — Progress Notes (Signed)
History of Present Illness: [name] is a 53 y.o. male with history of CAD, HTN, HLD here today for cardiac followup. He was admitted 9/28-10/3/12 with an inferolateral ST elevation myocardial infarction. He suffered V. Fib arrest in the emergency room and required defibrillation x2 and intubation. Cardiac catheterization 02/02/11: distal LM 70-80%, ostial LAD 90%, proximal Circumflex occluded with L-R collats, EF 40% with inf HK. He underwent salvage PCI with thrombectomy and balloon angioplasty of the occluded circumflex. He was then taken for emergent bypass. Grafts included LIMA to LAD, SVG to Diagonal, SVG to OM1&OM2. (Dr. Donata ClayVan Trigt). His postoperative course was uneventful. Plavix was added post op due to presentation with MI and PCI. Echocardiogram 02/06/11: Mild LVH, EF 65%, mild MR, mild RVE, mildly reduced RVSF. He was last seen in our office in November 2013. At the end of November he felt weak and dizzy. [facility] was [facility] ED and was admitted. He underwent a [facility] November 2013 and there was no ischemia. His LVEF was 65%.   He tells me today that he has been doing well. No chest pains. His breathing is ok. No lower ext edema, orthopnea, PND.   Primary Care Physician: Andi DevonKimberly Shelton   Last Lipid Profile: Followed in primary care. TC 152, HDL 53, LDL 77, TG 111  Past Medical History  Diagnosis Date  . Obesity   . CAD (coronary artery disease)     IL STEMI 9/12:  Cardiac catheterization 02/02/11: dLM 70-80%, oLAD 90%, pCFX occluded with L-R collats, EF 40% with inf HK.  He underwent salvage PCI with thrombectomy and balloon angioplasty of the occluded circumflex.  He was then taken for emergent bypass.  Grafts included L-LAD, S-Dx, S-OM1&OM2  . HLD (hyperlipidemia)   . HTN (hypertension)     Past Surgical History  Procedure Laterality Date  . Cardiac catheterization  02/02/11  . Coronary artery bypass graft  02/03/11  . Wisdom tooth extraction    . Dental  surgery on tooth that did not descend      Current Outpatient Prescriptions  Medication Sig Dispense Refill  . Ascorbic Acid (VITAMIN C) 1000 MG tablet Take 1,000 mg by mouth daily.      Marland Kitchen. aspirin 81 MG tablet Take 81 mg by mouth daily.        Marland Kitchen. atorvastatin (LIPITOR) 40 MG tablet Take 40 mg by mouth daily.        . Cholecalciferol (VITAMIN D-3 PO) Take 2,000 Units by mouth daily.      . Coenzyme Q10 (CO Q 10 PO) Take 300 mg by mouth daily.      . metoprolol tartrate (LOPRESSOR) 25 MG tablet Take 0.5 tablets (12.5 mg total) by mouth 2 (two) times daily.  90 tablet  1   No current facility-administered medications for this visit.    No Known Allergies  History   Social History  . Marital Status: Married    Spouse Name: N/A    Number of Children: 0  . Years of Education: N/A   Occupational History  . Information Technology   .     Social History Main Topics  . Smoking status: Former Smoker -- 1.00 packs/day for 22 years    Types: Cigarettes    Quit date: 05/07/1997  . Smokeless tobacco: Not on file  . Alcohol Use: 0.5 oz/week    1 drink(s) per week  . Drug Use: No  . Sexual Activity: Not on file  Other Topics Concern  . Not on file   Social History Narrative  . No narrative on file    Family History  Problem Relation Age of Onset  . Cancer Father   . Breast cancer Mother     Review of Systems:  As stated in the HPI and otherwise negative.   BP 122/72  Pulse 80  Ht 5\' 5"  (1.651 m)  Wt 206 lb (93.441 kg)  BMI 34.28 kg/m2  Physical Examination: General: Well developed, well nourished, NAD HEENT: OP clear, mucus membranes moist SKIN: warm, dry. No rashes. Neuro: No focal deficits Musculoskeletal: Muscle strength 5/5 all ext Psychiatric: Mood and affect normal Neck: No JVD, no carotid bruits, no thyromegaly, no lymphadenopathy. Lungs:Clear bilaterally, no wheezes, rhonci, crackles Cardiovascular: Regular rate and rhythm. No murmurs, gallops or  rubs. Abdomen:Soft. Bowel sounds present. Non-tender.  Extremities: No lower extremity edema. Pulses are 2 + in the bilateral DP/PT.  EKG: NSR, rate 76 bpm.   Assessment and Plan:   1. CAD: Stable. He is doing well. No chest pain. He is very active. He is on good medical therapy.   2. HTN: BP well controlled.    3. HLD: He is on a statin. Lipids are well controlled in primary care.

## 2013-09-04 NOTE — Patient Instructions (Signed)
Your physician wants you to follow-up in:  12 months.  You will receive a reminder letter in the mail two months in advance. If you don't receive a letter, please call our office to schedule the follow-up appointment.   

## 2014-08-24 NOTE — H&P (Signed)
PATIENT NAME:  [name], [name] MR#:  [id number] DATE OF BIRTH:  05-20-1960  DATE OF ADMISSION:  04/05/2012  CHIEF COMPLAINT: Weak and dizzy.   HISTORY OF PRESENT ILLNESS: Mr. Derossett is a 54 year old white male with a history of coronary artery disease, status post non-ST segment elevation myocardial infarction with ventricular fibrillation arrest in October of 2013, admitted to [facility] for PCI with thrombectomy, angioplasty followed by three-vessel CABG, who presents today with signs and symptoms similar to when he presented to [facility]. He denies any history of chest pain remotely and currently but states that on the morning of admission he felt worsening fatigue which had started one day prior to admission. This morning he had a bowl of cereal, developed nausea and lower abdominal pressure and chills; and while walking across a parking lot from a restaurant he became very pale and felt like he was going to faint. He was brought to the Emergency Room and has been observed for the last several hours with negative troponins and no EKG changes. He states that he continues to feel the symptoms that he felt this morning although they come and go in waves. He has some indigestion but thinks that it may be due to hunger since he has not eaten since 9:00 this morning. He is followed by Dr. Clifton James of [facility] and was last seen one week ago. He states that all of his blood work has been normal, but according to my review of EPIC the patient has not had blood work since December 2012. He has been compliant with his medications and has been smoke-free for over 15 years.   PRIMARY CARE PHYSICIAN: The patient's primary care provider is Kellie Shropshire. Again, cardiologist is Dr. Clifton James of [facility] in [place].   PAST MEDICAL HISTORY: 1. Non ST-segment elevation myocardial infarction with ventricular fibrillation arrest requiring cardiopulmonary resuscitation and defibrillation x2. Cardiac catheterization  showed occluded circumflex, 70 to 80% distal left main, 90% ostial LAD lesion. He underwent thrombectomy and angioplasty followed by CABG by Dr. Mervin Hack with a LIMA to the LAD, saphenous vein graft to the diagonal, saphenous vein graft to OM1 and OM2. Echo done in October 2012 showed ejection fraction of 65% with mild MR, and mild right ventricular enlargement, and mild left ventricular hypertrophy.  2. Hyperlipidemia.  3. Hypertension.   CURRENT MEDICATIONS:   1. Metoprolol tartrate 12.5 mg twice daily.  2. Lisinopril 5 mg daily.  3. Coenzyme Q-10 300 mg daily.  4. Vitamin D3, 2000 units daily.  5. Lipitor 40 mg daily.  6. Aspirin 81 mg daily.  7. Ascorbic acid 1000 mg daily.    PAST SURGICAL HISTORY: CABG in October 2013 at [facility], again LIMA to the LAD, saphenous vein graft to the diagonal, saphenous vein graft to OM1 and OM2.   ALLERGIES:  He has no known drug allergies.   LAST HOSPITALIZATION: October 2012.   FAMILY HISTORY: Father has valvular disease but no coronary artery disease. There is no history of diabetes or sudden death in family.   SOCIAL HISTORY: He is married. He is an ex-smoker, having quit 15 years ago. 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 His exercise regimen has been hit and miss in the past month, but he is able to walk for close to a mile without symptoms.   REVIEW OF SYSTEMS: CONSTITUTIONAL: Positive for fatigue which started one day prior to admission, weakness which  started on the day of admission. No weight changes. HEENT: He denies any blurred or double vision. Denies any history of cataracts. No history of seasonal rhinitis, epistaxis or discharge. He does snore, per wife. He has no difficulty swallowing. RESPIRATORY: Review of systems is negative for wheezing and hemoptysis, but he has noted some dry cough today. CARDIOVASCULAR: He denies chest pain, orthopnea, and edema. He does feel  presyncopal on the day of admission. GI: He has not had any nausea, vomiting, diarrhea, or abdominal pain and has had no recent changes in bowel habits. He does not have constipation. ENDOCRINE: He has no history of polyuria, nocturia, or thyroid problems. No history of increased sweating or thirst. HEMATOLOGIC/LYMPHATIC: He has no history of anemia, easy bruising or bleeding. No history of swollen glands. MUSCULOSKELETAL: He has no chronic neck, back, shoulder, knee or hip pain and has no history of gout. NEUROLOGICAL:   He has no history of numbness, dysarthria or epilepsy but has had some generalized weakness today. No history of headaches or tremor. He does have a history of insomnia which he refuses to treat with medications. He has trouble falling asleep.  PHYSICAL EXAMINATION:  GENERAL: This is a middle-aged male in no apparent distress.   VITAL SIGNS: Blood pressure is 140/75, pulse is 75, rhythm is regular, and temperature is 98.0. He is saturating 100% on room air. Weight is 185 pounds.   HEENT: Pupils are equal, round, and reactive to light. Extraocular movements are intact. Sclerae are anicteric. Oropharynx is benign.   NECK: Supple without lymphadenopathy, JVD, thyromegaly, or carotid bruits.   LUNGS: Clear to auscultation bilaterally with no wheezing or rhonchi. Somewhat diminished breath sounds at the bases.   CARDIOVASCULAR: Regular rate and rhythm with no murmurs, rubs, or gallops. Chest wall is nontender. A well-healed median sternotomy scar is noted.   ABDOMEN: Soft, nontender, and nondistended with good bowel sounds and no evidence of hepatosplenomegaly.   MUSCULOSKELETAL: Exam is normal with five out of five strength in all four extremities.   SKIN: Skin is warm and dry without rashes or lesions.   LYMPH: There is no cervical, axillary, inguinal, or supraclavicular lymphadenopathy.   NEUROLOGICAL: Exam is grossly nonfocal with intact cranial nerves, deep tendon reflexes and  sensation. The patient is alert and oriented to person, place, and time. He is cooperative and calm.   LABORATORY, DIAGNOSTIC AND RADIOLOGICAL DATA: Admission laboratory data: Sodium 134, potassium 3.9, chloride 101, bicarbonate 25, BUN 10, creatinine 0.89. Hemoglobin 17.4, white count 11.9, platelets 322. LFTs are normal except for AST of 39.0. CPK at 2:00 p.m. 155, MB 0.8. Troponin I less than 0.02. Repeat cardiac enzymes at 06:30: CPK 124, MB 0.8. Troponin I less than 0.02. EKG shows sinus rhythm with Q waves in the inferior leads which are chronic. No ST elevation or depression. Radiology: Prominent central pulmonary vascular congestion and possible interstitial edema on the left.    ASSESSMENT AND PLAN:  1. Anginal equivalent: Given patient's history of coronary artery disease which presented with similar symptoms, we will admit to telemetry bed with observation status, cycle enzymes and have [facility] see him in the morning for consideration of a stress test in house. The patient has already received aspirin. His enzymes and EKG are normal, so there is no indication for heparin at this time. We will continue statin, beta blocker and aspirin.  2. Hyperlipidemia: The patient has not had fasting lipids in a year. These will be ordered for in the  morning.  3. Obesity: He has no history of sleep apnea but does snore. We will consider scheduling as an outpatient for a sleep study.  4. Hypertension: Continue metoprolol and lisinopril.   ESTIMATED TIME OF CARE: 45 minutes.   ____________________________ Duncan Dull, MD tt:cbb D: 04/05/2012 19:34:27 ET T: 04/06/2012 08:16:35 ET JOB#: [id number]  cc: Duncan Dull, MD, <Dictator> Kellie Shropshire Duncan Dull MD ELECTRONICALLY SIGNED 05/03/2012 12:33

## 2014-08-24 NOTE — Consult Note (Signed)
Brief Consult Note: Diagnosis: possible unstable angina.   Patient was seen by consultant.   Consult note dictated.   Comments: Atypical symptoms but similar to previous MI.  Recommend a treadmil myocardial perfusion stress test tomorrow.  Electronic Signatures: Lorine BearsArida, Muhammad (MD)  (Signed 01-Dec-13 14:24)  Authored: Brief Consult Note   Last Updated: 01-Dec-13 14:24 by Lorine BearsArida, Muhammad (MD)

## 2014-08-24 NOTE — Consult Note (Signed)
PATIENT NAME:  [name], MORINO MR#:  [id number] DATE OF BIRTH:  [birth year]  DATE OF CONSULTATION:  04/06/2012  REFERRING PHYSICIAN:  Duncan Dull, MD  CONSULTING PHYSICIAN:  Chelsea Aus. Kirke Corin, MD  PRIMARY CARDIOLOGIST: Dr. Clifton James of [facility] in [place]   REASON FOR CONSULTATION: Possible angina.   HISTORY OF PRESENT ILLNESS: This is a pleasant 54 year old Caucasian male with known history of coronary artery disease. He presented in September of 2012 with acute inferolateral ST elevation myocardial infarction complicated by ventricular fibrillation arrest. He underwent emergent cardiac catheterization which showed an occluded left circumflex with significant left main and LAD disease. The patient underwent thrombectomy and angioplasty of the left circumflex followed by emergent CABG which included LIMA to LAD, SVG to diagonal, SVG to OM1 and OM2. The patient has done well since then. His echocardiogram postoperatively showed normal LV systolic function with an ejection fraction of 65%. The patient overall has been doing very well since that time. The patient was in his usual health until Friday when he felt week. On Saturday he was out to have a meal at [employer]. Before the food was served, the patient felt extremely weak, dizzy with diaphoresis. These symptoms were very similar to his presentation with myocardial infarction. The patient never had chest pain or shortness of breath with his previous MI. The patient continued to feel that way until he was brought to the Emergency Room. He had intermittent symptoms since then. Today he feels better. His cardiac enzymes are negative. ECG showed no acute ischemic changes.   PAST MEDICAL HISTORY:  1. Coronary artery disease with previous inferolateral ST elevation myocardial infarction in September of 2012 complicated by ventricular fibrillation arrest. Details of CAD are outlined above. He is status post CABG.  2. Hypertension.   3. Hyperlipidemia.  4. Previous tobacco use.   HOME MEDICATIONS:  1. Metoprolol tartrate 12.5 mg twice daily.  2. Lisinopril 5 mg daily.  3. Coenzyme Q10 300 mg once daily.  4. Vitamin D3 2000 units once daily.  5. Lipitor 40 mg once a day.  6. Aspirin 81 mg once a day.  7. Ascorbic acid 1000 mg once daily.   PAST SURGICAL HISTORY: CABG in October 2013.   ALLERGIES: No known drug allergies.   FAMILY HISTORY: Negative for premature coronary artery disease.   SOCIAL HISTORY: He is married. He is an ex-smoker, quit 15 years ago. 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 He drinks alcohol occasionally.   REVIEW OF SYSTEMS: A 10 point review of systems was performed. It is negative other than what is mentioned in the history of present illness.   PHYSICAL EXAMINATION:   GENERAL: The patient appears to be at his stated age in no acute distress.   VITAL SIGNS: Temperature 98.8, pulse 74, respiratory rate 18, blood pressure 112/76, oxygen saturation is 97% on room air.   HEENT: Normocephalic, atraumatic.   NECK: No JVD or carotid bruits.   RESPIRATORY: Normal respiratory effort with no use of accessory muscles. Auscultation reveals normal breath sounds.   CARDIOVASCULAR: Normal PMI. Normal S1 and S2 with no gallops or murmurs.   ABDOMEN: Benign, nontender, nondistended.   EXTREMITIES: No clubbing, cyanosis, or edema.   SKIN: Warm and dry with no rash.   PSYCHIATRIC: He is alert, oriented x3 with normal mood and affect.   LABORATORY AND DIAGNOSTIC DATA: His labs showed negative cardiac enzymes.   ECG showed sinus rhythm with evidence of prior inferior infarct with no  significant ST or T wave changes.   IMPRESSION:  1. Possible unstable angina with known history of coronary artery disease.  2. Hypertension.  3. Hyperlipidemia.   RECOMMENDATIONS: The patient presented with atypical symptoms which might be due to unstable angina given that his prior myocardial  infarction was manifested with very similar symptoms. He never had any chest pain or dyspnea. I do not see any other explanation for his symptoms. He ruled out for myocardial infarction. ECG did not show any acute changes. Due to his extensive cardiac history, I recommend inpatient evaluation with a treadmill nuclear stress test. In the meantime, continue current medications.   Further recommendations to follow after stress testing tomorrow.   ____________________________ Chelsea AusMuhammad A. Kirke CorinArida, MD maa:drc D: 04/06/2012 14:35:22 ET T: 04/06/2012 14:44:16 ET JOB#: [id number]  cc: Jerolyn CenterMuhammad A. Kirke CorinArida, MD, <Dictator> Kathleene Hazelhristopher D. McAlhany, MD Jerolyn CenterMUHAMMAD Argentina DonovanA Naomii Kreger MD ELECTRONICALLY SIGNED 04/15/2012 13:02

## 2014-08-24 NOTE — Discharge Summary (Signed)
PATIENT NAME:  [name], [name] MR#:  [id number] DATE OF BIRTH:  21-Aug-1960  DATE OF ADMISSION:  04/05/2012 DATE OF DISCHARGE:  04/07/2012   NOTE: Discharge likely on 04/07/2012 pending exercise stress test result.   CONSULTATION: Cardiology consult with Dr. Kirke CorinArida.   DISCHARGE MEDICATIONS:  1. Lisinopril 5 mg Kelly.o. daily.  2. Metoprolol 25 mg Kelly.o. b.i.d.  3. Atorvastatin 40 mg Kelly.o. daily.  4. Aspirin 81 mg daily.  5. CoQ10 300 mg Kelly.o. daily.  6. Vitamin C 1 gram Kelly.o. daily.   DIET: Low sodium, low fat diet.   HOSPITAL COURSE: The patient is a 54 year old male with a history of coronary artery disease and acute now inferolateral ST-elevation myocardial infarction complicated by ventricular fibrillation and cardiac arrest in September 2012,  status post emergency cardiac catheterization. At that time he had a bypass surgery in September. He came in because of extreme weakness, dizziness, and diaphoresis. The patient [facility], and the patient felt symptoms suddenly with weakness, dizziness, which were like the same symptoms that happened when he had acute myocardial infarction last year; and so he was admitted for anginal equivalent and he was monitored on telemetry. The patient, however, did not have any chest pain seen. He was seen by Dr. Kirke CorinArida. His troponins have been negative x3. EKG showed evidence of previous inferior infarct with no ST-T changes. The patient was asymptomatic since admission here. We did continue his home medications. He was seen by Dr. Kirke CorinArida, who recommended exercise stress test because of his bradycardic history; and so he did have a nuclear stress test this morning. We are waiting for the results. If they are within normal limits, the patient will be discharged home. If they are abnormal, we will keep the patient here and do further cardiac intervention.   LABORATORY, DIAGNOSTIC AND RADIOLOGICAL DATA: EKG showed sinus rhythm with some PACs 74 beats on  admission. Chest x-ray showed some low-grade interstitial edema on the left, and in the appropriate clinical setting the patient also had subsegmental atelectasis in the left perihilar region. The patient's troponin was less than 0.02 x3. Sodium 134, potassium 3.9, chloride 101, bicarbonate 25, BUN 10, creatinine 0.89, glucose 95. WBC 11.9, hemoglobin 17.1, hematocrit 39.8, platelets 322. Troponin less than 0.02, LDL 78. Triglycerides 145. WBC is around 11.6. The patient has no cough and does not have chest pain. No fever. Urinalysis is also showing no evidence of bacteria, no WBC in urine. If stress test is negative, he will be discharged home.   TIME SPENT:   Time spent on discharge preparation was more than 30 minutes.   ____________________________ Katha HammingSnehalatha Keyshawn Hellwig, MD sk:cbb D: 04/07/2012 13:16:45 ET T: 04/07/2012 13:28:36 ET JOB#: [id number]  cc: Katha HammingSnehalatha Shanterica Biehler, MD, <Dictator> Katha HammingSNEHALATHA Maddex Garlitz MD ELECTRONICALLY SIGNED 04/22/2012 15:03

## 2014-09-08 NOTE — Progress Notes (Signed)
Chief Complaint  Patient presents with  . Coronary Artery Disease   History of Present Illness: [name] is a 54 yo male with history of CAD, HTN, HLD here today for cardiac followup. He was admitted 9/28-10/3/12 with an inferolateral ST elevation myocardial infarction. He suffered V. Fib arrest in the emergency room and required defibrillation x2 and intubation. Cardiac catheterization 02/02/11: distal LM 70-80%, ostial LAD 90%, proximal Circumflex occluded with L-R collats, EF 40% with inf HK. He underwent salvage PCI with thrombectomy and balloon angioplasty of the occluded circumflex. He was then taken for emergent bypass. Grafts included LIMA to LAD, SVG to Diagonal, SVG to OM1&OM2. (Dr. Donata ClayVan Trigt). His postoperative course was uneventful. Plavix was added post op due to presentation with MI and PCI. Echocardiogram 02/06/11: Mild LVH, EF 65%, mild MR, mild RVE, mildly reduced RVSF. He [facility] November 2013 with weakness and dizziness and was admitted. He underwent a stress myoview at [facility] November 2013 and there was no ischemia. His LVEF was 65%.   He tells me today that he has been doing well. No chest pain or SOB. No lower ext edema, orthopnea, PND. He is trying to lose weight.   Primary Care Physician: Andi DevonKimberly Shelton   Last Lipid Profile: Followed in primary care.   Past Medical History  Diagnosis Date  . Obesity   . CAD (coronary artery disease)     IL STEMI 9/12:  Cardiac catheterization 02/02/11: dLM 70-80%, oLAD 90%, pCFX occluded with L-R collats, EF 40% with inf HK.  He underwent salvage PCI with thrombectomy and balloon angioplasty of the occluded circumflex.  He was then taken for emergent bypass.  Grafts included L-LAD, S-Dx, S-OM1&OM2  . HLD (hyperlipidemia)   . HTN (hypertension)     Past Surgical History  Procedure Laterality Date  . Cardiac catheterization  02/02/11  . Coronary artery bypass graft  02/03/11  . Wisdom tooth extraction    . Dental  surgery on tooth that did not descend      Current Outpatient Prescriptions  Medication Sig Dispense Refill  . Ascorbic Acid (VITAMIN C) 1000 MG tablet Take 1,000 mg by mouth daily.    Marland Kitchen. aspirin 81 MG tablet Take 81 mg by mouth daily.      Marland Kitchen. atorvastatin (LIPITOR) 40 MG tablet Take 40 mg by mouth daily.      . Cholecalciferol (VITAMIN D-3 PO) Take 2,000 Units by mouth daily.    . Coenzyme Q10 (CO Q 10 PO) Take 300 mg by mouth daily.    . metoprolol tartrate (LOPRESSOR) 25 MG tablet Take 0.5 tablets (12.5 mg total) by mouth 2 (two) times daily. 90 tablet 3   No current facility-administered medications for this visit.    No Known Allergies  History   Social History  . Marital Status: Married    Spouse Name: N/A  . Number of Children: 0  . Years of Education: N/A   Occupational History  . Information Technology   .     Social History Main Topics  . Smoking status: Former Smoker -- 1.00 packs/day for 22 years    Types: Cigarettes    Quit date: 05/07/1997  . Smokeless tobacco: Not on file  . Alcohol Use: 0.5 oz/week    1 drink(s) per week  . Drug Use: No  . Sexual Activity: Not on file   Other Topics Concern  . Not on file   Social History Narrative  Family History  Problem Relation Age of Onset  . Cancer Father   . Breast cancer Mother     Review of Systems:  As stated in the HPI and otherwise negative.   BP 114/84 mmHg  Pulse 80  Ht 5\' 4"  (1.626 m)  Wt 214 lb (97.07 kg)  BMI 36.72 kg/m2  Physical Examination: General: Well developed, well nourished, NAD HEENT: OP clear, mucus membranes moist SKIN: warm, dry. No rashes. Neuro: No focal deficits Musculoskeletal: Muscle strength 5/5 all ext Psychiatric: Mood and affect normal Neck: No JVD, no carotid bruits, no thyromegaly, no lymphadenopathy. Lungs:Clear bilaterally, no wheezes, rhonci, crackles Cardiovascular: Regular rate and rhythm. No murmurs, gallops or rubs. Abdomen:Soft. Bowel sounds present.  Non-tender.  Extremities: No lower extremity edema. Pulses are 2 + in the bilateral DP/PT.  EKG:  EKG is ordered today. The ekg ordered today demonstrates NSR 1st degree AV block. Old inferior infarct  Recent Labs: No results found for requested labs within last 365 days.   Lipid Panel No results found for: CHOL, TRIG, HDL, CHOLHDL, VLDL, LDLCALC, LDLDIRECT   Wt Readings from Last 3 Encounters:  09/09/14 214 lb (97.07 kg)  09/04/13 206 lb (93.441 kg)  09/04/12 195 lb (88.451 kg)     Other studies Reviewed: Additional studies/ records that were reviewed today include: . Review of the above records demonstrates:    Assessment and Plan:   1. CAD: Stable. He is doing well. No chest pain. He is very active. He is on good medical therapy. No changes. Will arrange exercise stress test to exclude ischemia since it has been 2.5 years since last screening.   2. HTN: BP well controlled.  No changes.   3. HLD: He is on a statin. Lipids are well controlled in primary care.   Current medicines are reviewed at length with the patient today.  The patient does not have concerns regarding medicines.  The following changes have been made:  no change  Labs/ tests ordered today include:   Orders Placed This Encounter  Procedures  . Exercise Tolerance Test  . EKG 12-Lead    Disposition:   FU with me in  12  months  Signed, Verne Carrowhristopher McAlhany, MD 09/09/2014 10:24 AM    [facility] [address], [place], [place]  [address] Phone: [phone]) [phone]; Fax: [phone]) [phone]

## 2014-09-09 ENCOUNTER — Ambulatory Visit (INDEPENDENT_AMBULATORY_CARE_PROVIDER_SITE_OTHER): Payer: BLUE CROSS/BLUE SHIELD | Admitting: Cardiovascular Disease

## 2014-09-09 ENCOUNTER — Encounter: Payer: Self-pay | Admitting: Cardiovascular Disease

## 2014-09-09 VITALS — BP 114/84 | HR 80 | Ht 64.0 in | Wt 214.0 lb

## 2014-09-09 DIAGNOSIS — I10 Essential (primary) hypertension: Secondary | ICD-10-CM | POA: Diagnosis not present

## 2014-09-09 DIAGNOSIS — E785 Hyperlipidemia, unspecified: Secondary | ICD-10-CM

## 2014-09-09 DIAGNOSIS — I25810 Atherosclerosis of coronary artery bypass graft(s) without angina pectoris: Secondary | ICD-10-CM

## 2014-09-09 MED ORDER — METOPROLOL TARTRATE 25 MG PO TABS: 12.5000 mg | ORAL_TABLET | Freq: Two times a day (BID) | ORAL | Status: DC

## 2014-09-09 NOTE — Patient Instructions (Signed)
Medication Instructions:  Your physician recommends that you continue on your current medications as directed. Please refer to the Current Medication list given to you today.   Labwork:  None  Testing/Procedures: Your physician has requested that you have an exercise tolerance test. For further information please visit [email or web address]. Please also follow instruction sheet, as given.     Follow-Up:  Your physician wants you to follow-up in: 12 months.  You will receive a reminder letter in the mail two months in advance. If you don't receive a letter, please call our office to schedule the follow-up appointment.    .Marland Kitchen

## 2014-10-20 ENCOUNTER — Ambulatory Visit (INDEPENDENT_AMBULATORY_CARE_PROVIDER_SITE_OTHER): Payer: BLUE CROSS/BLUE SHIELD | Admitting: Physician Assistant

## 2014-10-20 DIAGNOSIS — I25810 Atherosclerosis of coronary artery bypass graft(s) without angina pectoris: Secondary | ICD-10-CM

## 2014-10-20 LAB — EXERCISE TOLERANCE TEST
CHL CUP MPHR: 169 {beats}/min
CSEPEW: 10.1 METS
CSEPHR: 95 %
CSEPPHR: 162 {beats}/min
Exercise duration (min): 8 min
RPE: 16
Rest HR: 94 {beats}/min

## 2015-11-14 ENCOUNTER — Telehealth: Payer: Self-pay

## 2015-11-14 NOTE — Telephone Encounter (Signed)
lmtcb to update family and medical history 

## 2015-12-05 ENCOUNTER — Encounter: Payer: Self-pay | Admitting: Cardiovascular Disease

## 2015-12-05 ENCOUNTER — Encounter (INDEPENDENT_AMBULATORY_CARE_PROVIDER_SITE_OTHER): Payer: Self-pay

## 2015-12-05 ENCOUNTER — Ambulatory Visit (INDEPENDENT_AMBULATORY_CARE_PROVIDER_SITE_OTHER): Payer: BLUE CROSS/BLUE SHIELD | Admitting: Cardiovascular Disease

## 2015-12-05 VITALS — BP 135/89 | HR 74 | Ht 65.0 in | Wt 222.8 lb

## 2015-12-05 DIAGNOSIS — E785 Hyperlipidemia, unspecified: Secondary | ICD-10-CM | POA: Diagnosis not present

## 2015-12-05 DIAGNOSIS — I25810 Atherosclerosis of coronary artery bypass graft(s) without angina pectoris: Secondary | ICD-10-CM | POA: Diagnosis not present

## 2015-12-05 DIAGNOSIS — I10 Essential (primary) hypertension: Secondary | ICD-10-CM

## 2015-12-05 NOTE — Patient Instructions (Signed)

## 2015-12-05 NOTE — Progress Notes (Signed)
Chief Complaint  Patient presents with  . Coronary Artery Disease    no sx   History of Present Illness: [name] is a 55 yo male with history of CAD, HTN, HLD here today for cardiac followup. He was admitted 9/28-10/3/12 with an inferolateral ST elevation myocardial infarction. He suffered V. Fib arrest in the emergency room and required defibrillation x2 and intubation. Cardiac catheterization 02/02/11: distal LM 70-80%, ostial LAD 90%, proximal Circumflex occluded with L-R collats, EF 40% with inf HK. He underwent PCI with thrombectomy and balloon angioplasty of the occluded circumflex. He was then taken for emergent bypass. Grafts included LIMA to LAD, SVG to Diagonal, SVG to OM1&OM2. (Dr. Donata Clay). His postoperative course was uneventful. Plavix was added post op due to presentation with MI and PCI. Echocardiogram 02/06/11: Mild LVH, EF 65%, mild MR, mild RVE, mildly reduced RVSF. He was seen in the [facility] ED November 2013 with weakness and dizziness and was admitted. He underwent a stress myoview at [facility] November 2013 and there was no ischemia. His LVEF was 65%. Exercise stress test June 2016 with no ischemia.   He tells me today that he has been doing well. No chest pain or SOB. No lower ext edema, orthopnea, PND. He is trying to lose weight.   Primary Care Physician: Alva Garnet., MD  Last Lipid Profile: Followed in primary care.   Past Medical History:  Diagnosis Date  . CAD (coronary artery disease)    IL STEMI 9/12:  Cardiac catheterization 02/02/11: dLM 70-80%, oLAD 90%, pCFX occluded with L-R collats, EF 40% with inf HK.  He underwent salvage PCI with thrombectomy and balloon angioplasty of the occluded circumflex.  He was then taken for emergent bypass.  Grafts included L-LAD, S-Dx, S-OM1&OM2  . HLD (hyperlipidemia)   . HTN (hypertension)   . Obesity     Past Surgical History:  Procedure Laterality Date  . CARDIAC CATHETERIZATION  02/02/11  . CORONARY ARTERY BYPASS  GRAFT  02/03/11  . Dental surgery on tooth that did not descend    . WISDOM TOOTH EXTRACTION      Current Outpatient Prescriptions  Medication Sig Dispense Refill  . Ascorbic Acid (VITAMIN C) 1000 MG tablet Take 1,000 mg by mouth daily.    Marland Kitchen aspirin 81 MG tablet Take 81 mg by mouth daily.      Marland Kitchen atorvastatin (LIPITOR) 40 MG tablet Take 40 mg by mouth daily.      . Cholecalciferol (VITAMIN D-3 PO) Take 2,000 Units by mouth daily.    . Coenzyme Q10 (CO Q 10 PO) Take 300 mg by mouth daily.    . metoprolol tartrate (LOPRESSOR) 25 MG tablet Take 0.5 tablets (12.5 mg total) by mouth 2 (two) times daily. 90 tablet 3   No current facility-administered medications for this visit.     No Known Allergies  Social History   Social History  . Marital status: Married    Spouse name: N/A  . Number of children: 0  . 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 Smoking status: Former Smoker    Packs/day: 1.00    Years: 22.00    Types: Cigarettes    Quit date: 05/07/1997  . Smokeless tobacco: Former Neurosurgeon  . Alcohol use 0.5 oz/week    1 Standard drinks or equivalent per week  . Drug use: No  . Sexual activity:  Not on file   Other Topics Concern  . Not on file   Social History Narrative  . No narrative on file    Family History  Problem Relation Age of Onset  . Cancer Father   . Breast cancer Mother     Review of Systems:  As stated in the HPI and otherwise negative.   BP 135/89 (BP Location: Left Arm, Patient Position: Sitting, Cuff Size: Large)   Pulse 74   Ht 5\' 5"  (1.651 m)   Wt 222 lb 12.8 oz (101.1 kg)   BMI 37.08 kg/m   Physical Examination: General: Well developed, well nourished, NAD  HEENT: OP clear, mucus membranes moist  SKIN: warm, dry. No rashes. Neuro: No focal deficits  Musculoskeletal: Muscle strength 5/5 all ext  Psychiatric: Mood and affect normal  Neck: No JVD,  no carotid bruits, no thyromegaly, no lymphadenopathy.  Lungs:Clear bilaterally, no wheezes, rhonci, crackles Cardiovascular: Regular rate and rhythm. No murmurs, gallops or rubs. Abdomen:Soft. Bowel sounds present. Non-tender.  Extremities: No lower extremity edema. Pulses are 2 + in the bilateral DP/PT.  EKG:  EKG is ordered today. The ekg ordered today demonstrates NSR 1st degree AV block. Old inferior infarct  Recent Labs: No results found for requested labs within last 8760 hours.   Lipid Panel Followed in primary care   Wt Readings from Last 3 Encounters:  12/05/15 222 lb 12.8 oz (101.1 kg)  09/09/14 214 lb (97.1 kg)  09/04/13 206 lb (93.4 kg)     Other studies Reviewed: Additional studies/ records that were reviewed today include: . Review of the above records demonstrates:    Assessment and Plan:   1. CAD: Stable. He is doing well. No chest pain. He is very active. He is on good medical therapy. No changes. Continues ASA, statin, beta blocker.   2. HTN: BP well controlled.  No changes.   3. HLD: He is on a statin. Lipids are well controlled in primary care.   Current medicines are reviewed at length with the patient today.  The patient does not have concerns regarding medicines.  The following changes have been made:  no change  Labs/ tests ordered today include:   Orders Placed This Encounter  Procedures  . EKG 12-Lead    Disposition:   FU with me in  12  months  Signed, Verne Carrow, MD 12/05/2015 8:35 AM    [facility] [address] [place], [place], [place]  [address] Phone: [phone]; Fax: [phone]

## 2016-11-08 ENCOUNTER — Other Ambulatory Visit: Payer: Self-pay | Admitting: Gastroenterology

## 2016-11-08 DIAGNOSIS — R7989 Other specified abnormal findings of blood chemistry: Secondary | ICD-10-CM

## 2016-11-08 DIAGNOSIS — R945 Abnormal results of liver function studies: Principal | ICD-10-CM

## 2016-11-08 NOTE — Progress Notes (Signed)
Samuel Atiyeh MD 

## 2016-11-22 ENCOUNTER — Ambulatory Visit
Admission: RE | Admit: 2016-11-22 | Discharge: 2016-11-22 | Disposition: A | Discharge status: Home / Self Care | Payer: BLUE CROSS/BLUE SHIELD | Source: Ambulatory Visit | Attending: Gastroenterology | Admitting: Gastroenterology

## 2016-11-22 DIAGNOSIS — R945 Abnormal results of liver function studies: Principal | ICD-10-CM

## 2016-11-22 DIAGNOSIS — R7989 Other specified abnormal findings of blood chemistry: Secondary | ICD-10-CM

## 2016-12-19 ENCOUNTER — Ambulatory Visit (INDEPENDENT_AMBULATORY_CARE_PROVIDER_SITE_OTHER): Payer: BLUE CROSS/BLUE SHIELD | Admitting: Cardiovascular Disease

## 2016-12-19 ENCOUNTER — Encounter: Payer: Self-pay | Admitting: Cardiovascular Disease

## 2016-12-19 ENCOUNTER — Ambulatory Visit: Payer: BLUE CROSS/BLUE SHIELD | Admitting: Cardiovascular Disease

## 2016-12-19 ENCOUNTER — Telehealth: Payer: Self-pay | Admitting: Cardiovascular Disease

## 2016-12-19 VITALS — BP 110/70 | HR 84 | Ht 65.0 in | Wt 217.2 lb

## 2016-12-19 DIAGNOSIS — I10 Essential (primary) hypertension: Secondary | ICD-10-CM | POA: Diagnosis not present

## 2016-12-19 DIAGNOSIS — I25810 Atherosclerosis of coronary artery bypass graft(s) without angina pectoris: Secondary | ICD-10-CM | POA: Diagnosis not present

## 2016-12-19 DIAGNOSIS — E7800 Pure hypercholesterolemia, unspecified: Secondary | ICD-10-CM

## 2016-12-19 NOTE — Telephone Encounter (Signed)
Completed form given to medical records to fax 

## 2016-12-19 NOTE — Telephone Encounter (Signed)
New Message   Surgical clearance for anesthesia      [facility] Pre-operative Risk Assessment    Request for surgical clearance:  1. What type of surgery is being performed? colonoscopy  2. When is this surgery scheduled?  12/21/16   3. Are there any medications that need to be held prior to surgery and how long? no  4. Name of physician performing surgery?  Dr Loreta AveMann   5. What is your office phone and fax number? [phone] fax [phone]  [name] 12/19/2016, 3:59 PM  _________________________________________________________________   (provider comments below)

## 2016-12-19 NOTE — Progress Notes (Signed)
Chief Complaint  Patient presents with  . Follow-up    CAD   History of Present Illness: 56 yo male with history of CAD s/p CABG, HTN, HLD here today for cardiac followup. He was admitted in September 2012 with an inferolateral ST elevation myocardial infarction. He suffered V. Fib arrest in the emergency room and required defibrillation x2 and intubation. Cardiac cath 02/02/11 with 70-80% distal left main stenosis, 90% ostial LAD stenosis, occluded proximal Circumflex artery. He underwent PCI with thrombectomy and balloon angioplasty of the occluded circumflex. He was then taken for emergent bypass surgery. (LIMA to LAD, SVG to Diagonal, SVG to OM1&OM2). Echo October 2012 with mild LVH, LVEF 65%, mild MR. He [facility] 2013 and there was no ischemia. His LVEF was 65%. Exercise stress test June 2016 with no ischemia.   He is here today for follow up. The patient denies any chest pain, dyspnea, palpitations, lower extremity edema, orthopnea, PND, dizziness, near syncope or syncope. He has not been exercising.   Primary Care Physician: Andi DevonShelton, Kimberly, MD  Past Medical History:  Diagnosis Date  . CAD (coronary artery disease)    IL STEMI 9/12:  Cardiac catheterization 02/02/11: dLM 70-80%, oLAD 90%, pCFX occluded with L-R collats, EF 40% with inf HK.  He underwent salvage PCI with thrombectomy and balloon angioplasty of the occluded circumflex.  He was then taken for emergent bypass.  Grafts included L-LAD, S-Dx, S-OM1&OM2  . HLD (hyperlipidemia)   . HTN (hypertension)   . Obesity     Past Surgical History:  Procedure Laterality Date  . CARDIAC CATHETERIZATION  02/02/11  . CORONARY ARTERY BYPASS GRAFT  02/03/11  . Dental surgery on tooth that did not descend    . WISDOM TOOTH EXTRACTION      Current Outpatient Prescriptions  Medication Sig Dispense Refill  . Ascorbic Acid (VITAMIN C) 1000 MG tablet Take 1,000 mg by mouth daily.    Marland Kitchen. aspirin 81 MG tablet  Take 81 mg by mouth daily.      Marland Kitchen. atorvastatin (LIPITOR) 40 MG tablet Take 40 mg by mouth daily.      . Cholecalciferol (VITAMIN D-3 PO) Take 2,000 Units by mouth daily.    . Coenzyme Q10 (CO Q 10 PO) Take 300 mg by mouth daily.    Marland Kitchen. levocetirizine (XYZAL) 5 MG tablet Take 5 mg by mouth daily.    . metoprolol tartrate (LOPRESSOR) 25 MG tablet Take 0.5 tablets (12.5 mg total) by mouth 2 (two) times daily. 90 tablet 3   No current facility-administered medications for this visit.     No Known Allergies  Social History   Social History  . Marital status: Married    Spouse name: N/A  . Number of children: 0  . Years of education: N/A   Occupational History  . Information Technology   .  Government social research officerka Consulting Engineer   Social History Main Topics  . Smoking status: Former Smoker    Packs/day: 1.00    Years: 22.00    Types: Cigarettes    Quit date: 05/07/1997  . Smokeless tobacco: Former NeurosurgeonUser  . Alcohol use 0.5 oz/week    1 Standard drinks or equivalent per week  . Drug use: No  . Sexual activity: Not on file   Other Topics Concern  . Not on file   Social History Narrative  . No narrative on file    Family History  Problem Relation Age of Onset  .  Cancer Father   . Breast cancer Mother     Review of Systems:  As stated in the HPI and otherwise negative.   BP 110/70   Pulse 84   Ht 5\' 5"  (1.651 m)   Wt 217 lb 3.2 oz (98.5 kg)   SpO2 95%   BMI 36.14 kg/m   Physical Examination:  General: Well developed, well nourished, NAD  HEENT: OP clear, mucus membranes moist  SKIN: warm, dry. No rashes. Neuro: No focal deficits  Musculoskeletal: Muscle strength 5/5 all ext  Psychiatric: Mood and affect normal  Neck: No JVD, no carotid bruits, no thyromegaly, no lymphadenopathy.  Lungs:Clear bilaterally, no wheezes, rhonci, crackles Cardiovascular: Regular rate and rhythm. No murmurs, gallops or rubs. Abdomen:Soft. Bowel sounds present. Non-tender.  Extremities: No lower  extremity edema. Pulses are 2 + in the bilateral DP/PT.  EKG:  EKG is  ordered today. The ekg ordered today demonstrates NSR, rate 79 bpm  Recent Labs: No results found for requested labs within last 8760 hours.   Lipid Panel Followed in primary care   Wt Readings from Last 3 Encounters:  12/19/16 217 lb 3.2 oz (98.5 kg)  12/05/15 222 lb 12.8 oz (101.1 kg)  09/09/14 214 lb (97.1 kg)     Other studies Reviewed: Additional studies/ records that were reviewed today include: . Review of the above records demonstrates:    Assessment and Plan:   1. CAD without angina: He is having no chest pain suggestive of angina. He is very active. Will continue ASA, statin and beta blocker.    2. HTN: BP controlled. No changes.   3. HLD: Continue statin. Lipids followed in primary care.   Current medicines are reviewed at length with the patient today.  The patient does not have concerns regarding medicines.  The following changes have been made:  no change  Labs/ tests ordered today include:   No orders of the defined types were placed in this encounter.   Disposition:   FU with me in  12  months  Signed, Verne Carrow, MD 12/19/2016 4:38 PM    [facility] [address] [place], [place], [place]  [address] Phone: [phone]; Fax: [phone]

## 2016-12-19 NOTE — Patient Instructions (Signed)

## 2016-12-20 NOTE — Addendum Note (Signed)
Addended by: Vernard GamblesOLE, Markeesha Char S on: 12/20/2016 11:21 AM   Modules accepted: Orders

## 2018-02-13 ENCOUNTER — Ambulatory Visit (INDEPENDENT_AMBULATORY_CARE_PROVIDER_SITE_OTHER): Payer: 59 | Admitting: Cardiovascular Disease

## 2018-02-13 ENCOUNTER — Encounter: Payer: Self-pay | Admitting: Cardiovascular Disease

## 2018-02-13 VITALS — BP 120/76 | HR 82 | Ht 65.0 in | Wt 218.0 lb

## 2018-02-13 DIAGNOSIS — E7800 Pure hypercholesterolemia, unspecified: Secondary | ICD-10-CM | POA: Diagnosis not present

## 2018-02-13 DIAGNOSIS — I25810 Atherosclerosis of coronary artery bypass graft(s) without angina pectoris: Secondary | ICD-10-CM | POA: Diagnosis not present

## 2018-02-13 DIAGNOSIS — I10 Essential (primary) hypertension: Secondary | ICD-10-CM | POA: Diagnosis not present

## 2018-02-13 NOTE — Patient Instructions (Signed)

## 2018-02-13 NOTE — Progress Notes (Signed)
Chief Complaint  Patient presents with  . Follow-up    CAD   History of Present Illness: 57 yo male with history of CAD s/p CABG, HTN, HLD here today for cardiac followup. He was admitted in September 2012 with an inferolateral ST elevation myocardial infarction. He suffered V. Fib arrest in the emergency room and required defibrillation x2 and intubation. Cardiac cath 02/02/11 with 70-80% distal left main stenosis, 90% ostial LAD stenosis, occluded proximal Circumflex artery. He underwent PCI with thrombectomy and balloon angioplasty of the occluded circumflex. He was then taken for emergent bypass surgery. (LIMA to LAD, SVG to Diagonal, SVG to OM1&OM2). Echo October 2012 with mild LVH, LVEF 65%, mild MR. He underwent a stress myoview at [facility] November 2013 and there was no ischemia. His LVEF was 65%. Exercise stress test June 2016 with no ischemia. He had an anal fissure this year and stopped his ASA. He tried stopping ASA and the fissure came back. He has been off of it since August 2019.   He is here today for follow up. The patient denies any chest pain, dyspnea, palpitations, lower extremity edema, orthopnea, PND, dizziness, near syncope or syncope.   Primary Care Physician: Andi Devon, MD  Past Medical History:  Diagnosis Date  . CAD (coronary artery disease)    IL STEMI 9/12:  Cardiac catheterization 02/02/11: dLM 70-80%, oLAD 90%, pCFX occluded with L-R collats, EF 40% with inf HK.  He underwent salvage PCI with thrombectomy and balloon angioplasty of the occluded circumflex.  He was then taken for emergent bypass.  Grafts included L-LAD, S-Dx, S-OM1&OM2  . HLD (hyperlipidemia)   . HTN (hypertension)   . Obesity     Past Surgical History:  Procedure Laterality Date  . CARDIAC CATHETERIZATION  02/02/11  . CORONARY ARTERY BYPASS GRAFT  02/03/11  . Dental surgery on tooth that did not descend    . WISDOM TOOTH EXTRACTION      Current Outpatient Medications  Medication Sig  Dispense Refill  . atorvastatin (LIPITOR) 40 MG tablet Take 40 mg by mouth daily.      . cetirizine (ZYRTEC) 10 MG tablet Take 10 mg by mouth daily.    . metoprolol tartrate (LOPRESSOR) 25 MG tablet Take 0.5 tablets (12.5 mg total) by mouth 2 (two) times daily. 90 tablet 3   No current facility-administered medications for this visit.     No Known Allergies  Social History   Socioeconomic History  . Marital status: Married    Spouse name: Not on file  . Number of children: 0  . Years of education: Not on file  . Highest education level: Not on file  Occupational History  . Occupation: Armed forces technical officer: [employer]  Social Needs  . Financial resource strain: Not on file  . Food insecurity:    Worry: Not on file    Inability: Not on file  . Transportation needs:    Medical: Not on file    Non-medical: Not on file  Tobacco Use  . Smoking status: Former Smoker    Packs/day: 1.00    Years: 22.00    Pack years: 22.00    Types: Cigarettes    Last attempt to quit: 05/07/1997    Years since quitting: 20.7  . Smokeless tobacco: Former Engineer, water and Sexual Activity  . Alcohol use: Yes    Alcohol/week: 1.0 standard drinks    Types: 1 Standard drinks or equivalent per week  .  Drug use: No  . Sexual activity: Not on file  Lifestyle  . Physical activity:    Days per week: Not on file    Minutes per session: Not on file  . Stress: Not on file  Relationships  . Social connections:    Talks on phone: Not on file    Gets together: Not on file    Attends religious service: Not on file    Active member of club or organization: Not on file    Attends meetings of clubs or organizations: Not on file    Relationship status: Not on file  . Intimate partner violence:    Fear of current or ex partner: Not on file    Emotionally abused: Not on file    Physically abused: Not on file    Forced sexual activity: Not on file  Other Topics Concern  . Not  on file  Social History Narrative  . Not on file    Family History  Problem Relation Age of Onset  . Cancer Father   . Breast cancer Mother     Review of Systems:  As stated in the HPI and otherwise negative.   BP 120/76   Pulse 82   Ht 5\' 5"  (1.651 m)   Wt 218 lb (98.9 kg)   BMI 36.28 kg/m   Physical Examination: General: Well developed, well nourished, NAD  HEENT: OP clear, mucus membranes moist  SKIN: warm, dry. No rashes. Neuro: No focal deficits  Musculoskeletal: Muscle strength 5/5 all ext  Psychiatric: Mood and affect normal  Neck: No JVD, no carotid bruits, no thyromegaly, no lymphadenopathy.  Lungs:Clear bilaterally, no wheezes, rhonci, crackles Cardiovascular: Regular rate and rhythm. No murmurs, gallops or rubs. Abdomen:Soft. Bowel sounds present. Non-tender.  Extremities: No lower extremity edema. Pulses are 2 + in the bilateral DP/PT.   EKG:  EKG is ordered today. The ekg ordered today demonstrates NSR, rate 82 bpm. Inferior Q waves. Unchanged from last EKG  Recent Labs: No results found for requested labs within last 8760 hours.   Lipid Panel Followed in primary care   Wt Readings from Last 3 Encounters:  02/13/18 218 lb (98.9 kg)  12/19/16 217 lb 3.2 oz (98.5 kg)  12/05/15 222 lb 12.8 oz (101.1 kg)     Other studies Reviewed: Additional studies/ records that were reviewed today include: . Review of the above records demonstrates:    Assessment and Plan:   1. CAD without angina: No chest pain. Continue ASA, statin and beta blocker.     2. HTN: BP is controlled. No changes  3. HLD: Followed in primary care. Continue statin  Current medicines are reviewed at length with the patient today.  The patient does not have concerns regarding medicines.  The following changes have been made:  no change  Labs/ tests ordered today include:   Orders Placed This Encounter  Procedures  . EKG 12-Lead    Disposition:   FU with me in  12   months  Signed, Verne Carrow, MD 02/13/2018 4:06 PM    [facility] [address] [place], [place], [place]  [address] Phone: [phone]; Fax: [phone]

## 2018-05-06 IMAGING — US US ABDOMEN LIMITED
1 series · 14 of 25 positions shown · non-contrast
Comparison: None

CLINICAL DATA: Elevated LFTs

EXAM:
ULTRASOUND ABDOMEN LIMITED RIGHT UPPER QUADRANT

[Series 1: us abdomen limited · 0.25mm/px · 14 of 43 slices shown]
[im 1/43]
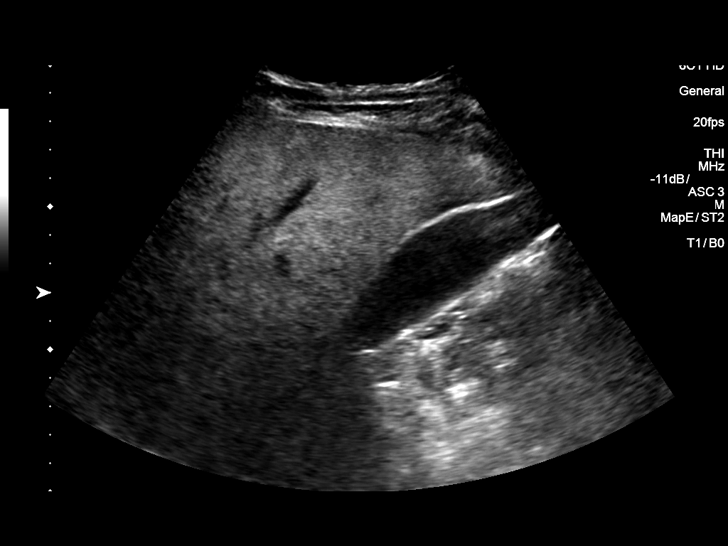
[im 4/43]
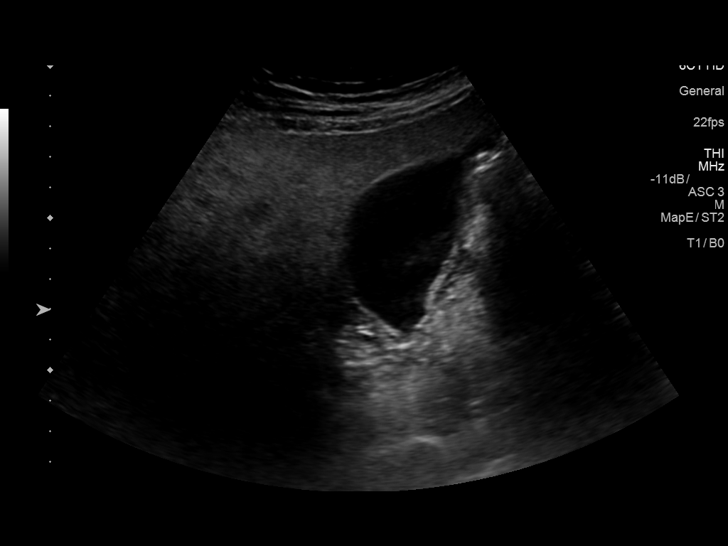
[im 8/43]
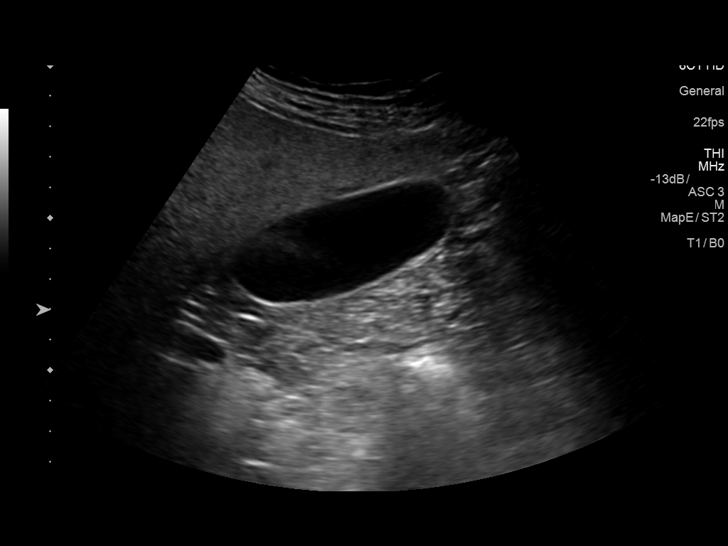
[im 11/43]
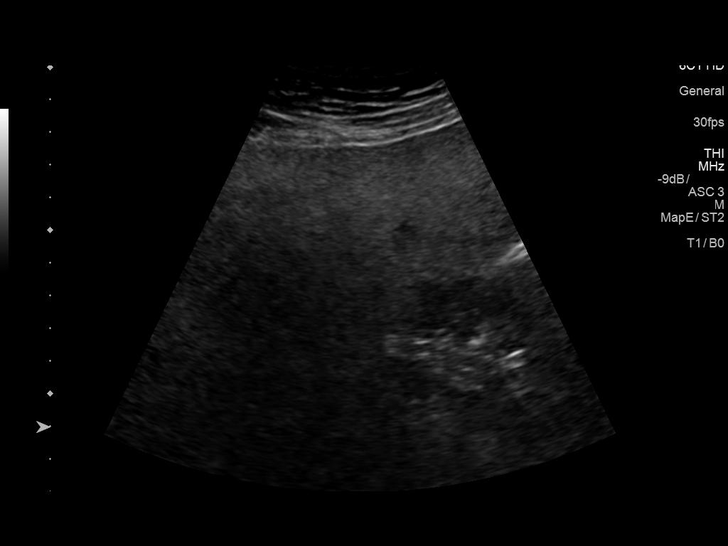
[im 15/43]
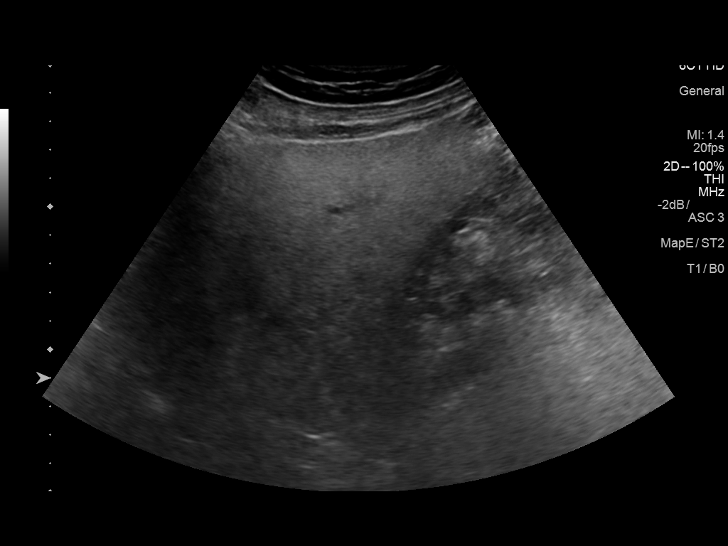
[im 16/43]
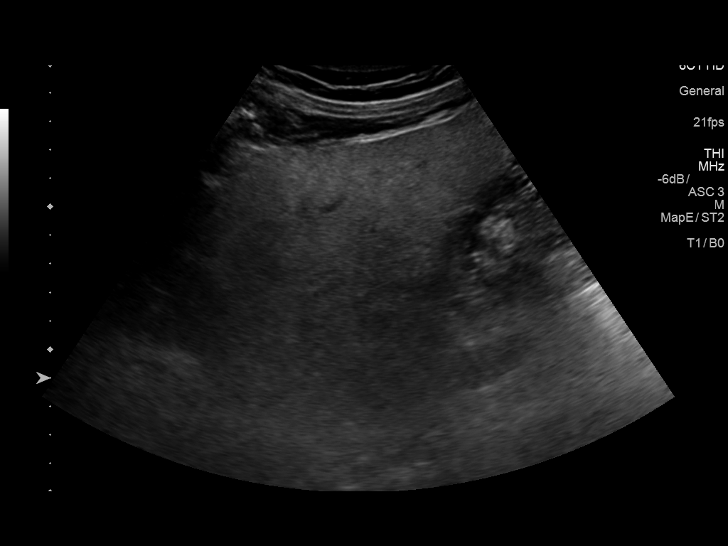
[im 20/43]
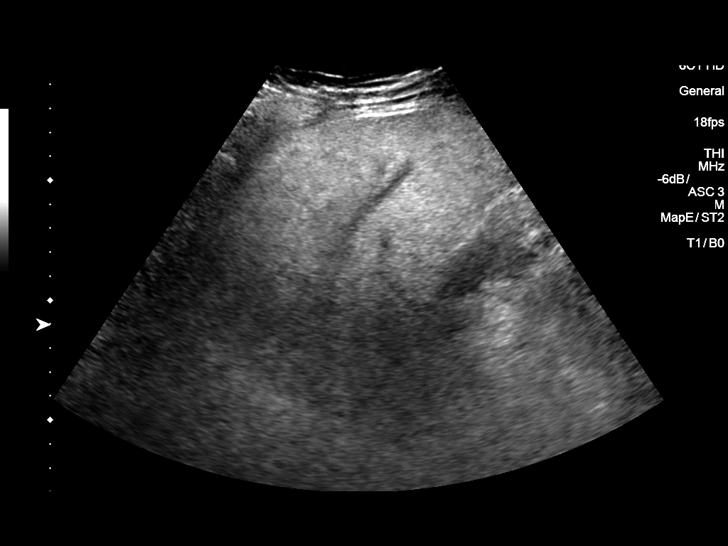
[im 23/43]
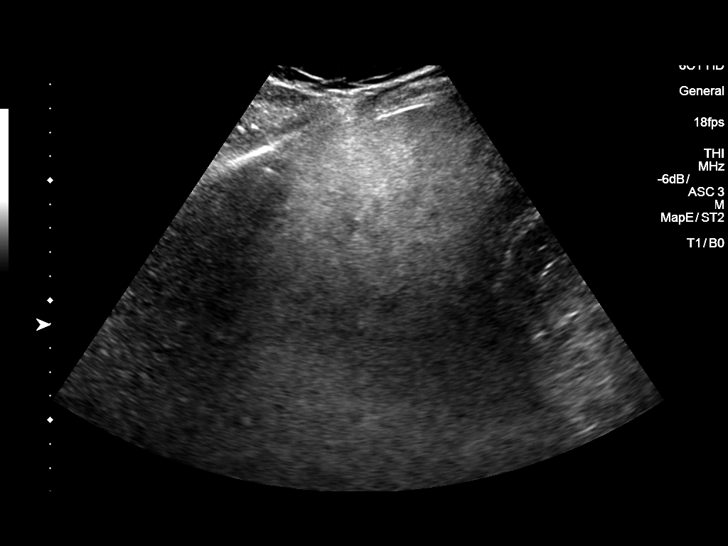
[im 27/43]
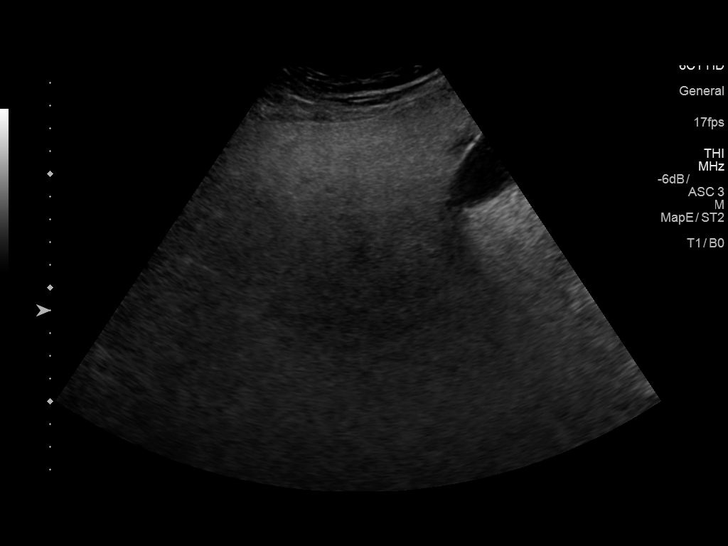
[im 29/43]
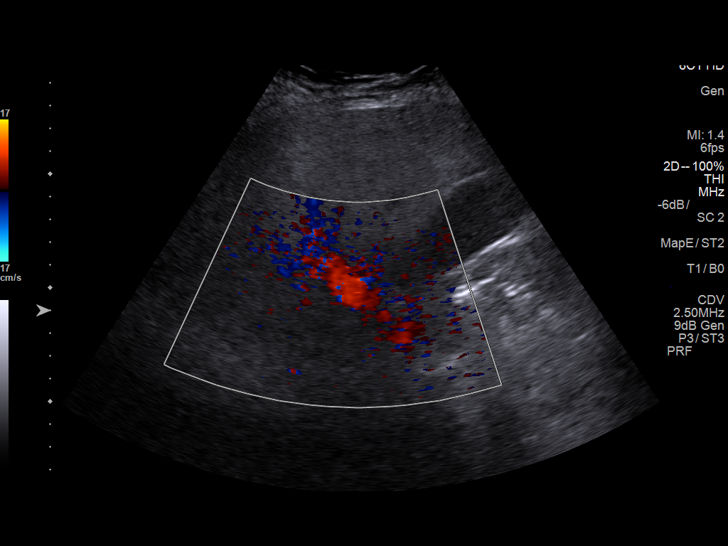
[im 32/43]
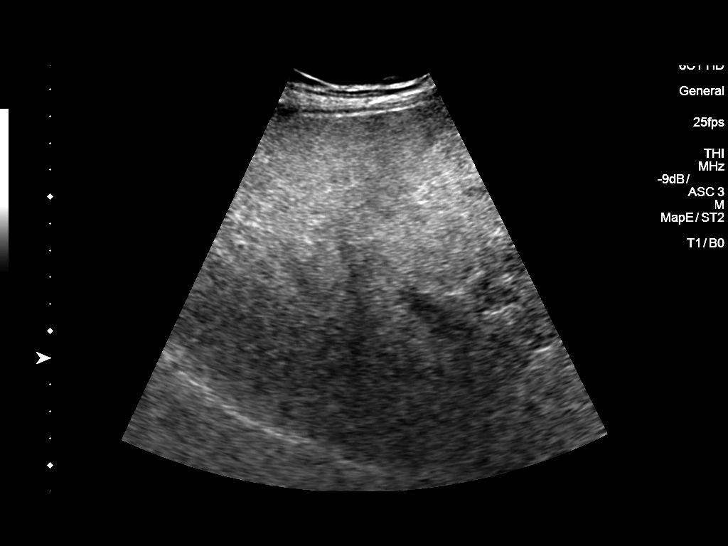
[im 36/43]
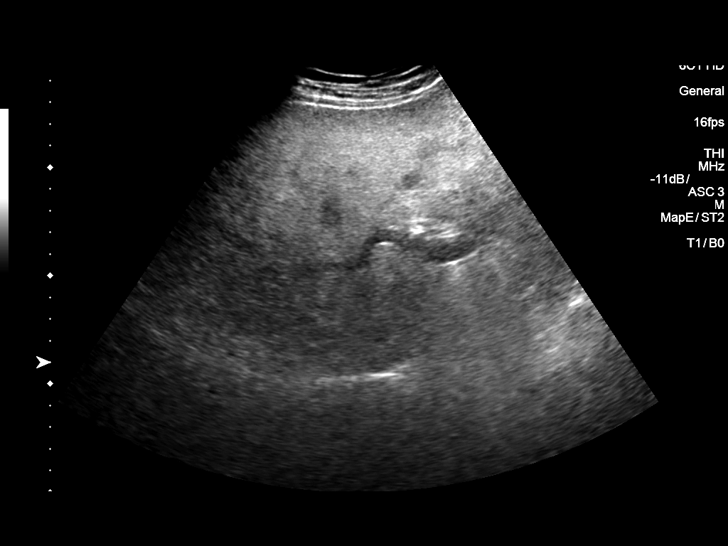
[im 39/43]
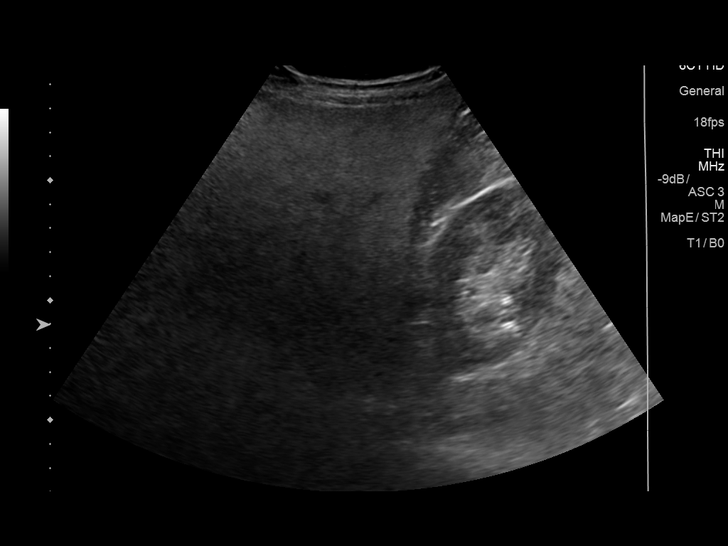
[im 43/43]
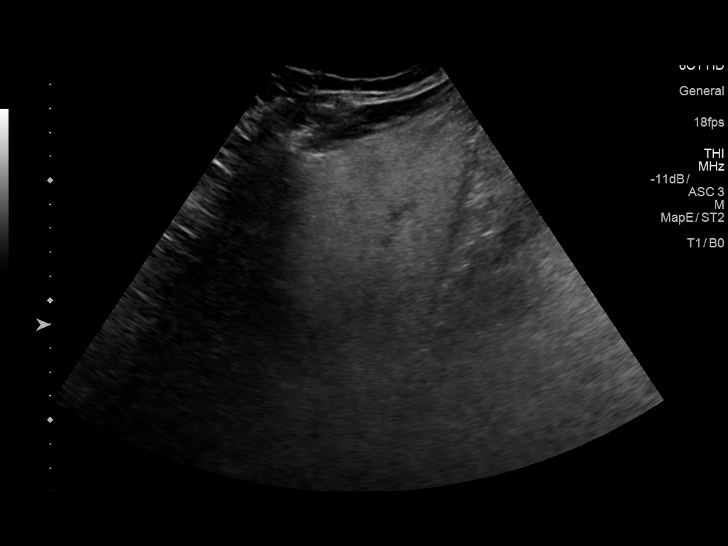

[14 of 25 positions shown; findings below may reference images not displayed]

FINDINGS: Gallbladder:

Normally distended without stones or wall thickening. No
pericholecystic fluid or sonographic Murphy sign.

Common bile duct:

Diameter: Normal caliber 3 mm diameter

Liver:

Echogenic parenchyma, likely fatty infiltration though this can be
seen with cirrhosis and certain infiltrative disorders. No focal
hepatic mass or nodularity identified though assessment of the deep
portions of liver is suboptimal due to sound attenuation.
Hepatopetal portal venous flow.

No RIGHT upper quadrant free fluid.
IMPRESSION: Probable fatty infiltration of liver as above.

Otherwise negative exam.

## 2019-05-10 NOTE — Progress Notes (Signed)
Chief Complaint  Patient presents with  . Follow-up    CAD   History of Present Illness: 59 yo male with history of CAD s/p CABG, HTN, HLD here today for cardiac followup. He was admitted in September 2012 with an inferolateral ST elevation myocardial infarction. He suffered V. Fib arrest in the emergency room and required defibrillation x2 and intubation. Cardiac cath 02/02/11 with 70-80% distal left main stenosis, 90% ostial LAD stenosis, occluded proximal Circumflex artery. He underwent PCI with thrombectomy and balloon angioplasty of the occluded circumflex. He was then taken for emergent bypass surgery. (LIMA to LAD, SVG to Diagonal, SVG to OM1&OM2). Echo October 2012 with mild LVH, LVEF 65%, mild MR. He underwent a stress myoview at [facility] November 2013 and there was no ischemia. His LVEF was 65%. Exercise stress test June 2016 with no ischemia. He has been off of ASA due to issues with an anal fissure.   He is here today for follow up. The patient denies any chest pain, dyspnea, palpitations, lower extremity edema, orthopnea, PND, dizziness, near syncope or syncope. One episode of sweating   Primary Care Physician: Andi Devon, MD  Past Medical History:  Diagnosis Date  . CAD (coronary artery disease)    IL STEMI 9/12:  Cardiac catheterization 02/02/11: dLM 70-80%, oLAD 90%, pCFX occluded with L-R collats, EF 40% with inf HK.  He underwent salvage PCI with thrombectomy and balloon angioplasty of the occluded circumflex.  He was then taken for emergent bypass.  Grafts included L-LAD, S-Dx, S-OM1&OM2  . HLD (hyperlipidemia)   . HTN (hypertension)   . Obesity     Past Surgical History:  Procedure Laterality Date  . CARDIAC CATHETERIZATION  02/02/11  . CORONARY ARTERY BYPASS GRAFT  02/03/11  . Dental surgery on tooth that did not descend    . WISDOM TOOTH EXTRACTION      Current Outpatient Medications  Medication Sig Dispense Refill  . ascorbic acid (VITAMIN C) 1000 MG tablet  Take 1,000 mg by mouth daily.    Marland Kitchen atorvastatin (LIPITOR) 40 MG tablet Take 40 mg by mouth daily.      . cetirizine (ZYRTEC) 10 MG tablet Take 10 mg by mouth daily.    . Coenzyme Q10 300 MG CAPS Take 300 mg by mouth daily.    . metoprolol tartrate (LOPRESSOR) 25 MG tablet Take 0.5 tablets (12.5 mg total) by mouth 2 (two) times daily. 90 tablet 3  . aspirin EC 81 MG tablet Take 1 tablet (81 mg total) by mouth every other day. 90 tablet 3   No current facility-administered medications for this visit.    No Known Allergies  Social History   Socioeconomic History  . Marital status: Married    Spouse name: Not on file  . Number of children: 0  . Years of education: Not on file  . Highest education level: Not on file  Occupational History  . Occupation: Armed forces technical officer: [employer]  Tobacco Use  . Smoking status: Former Smoker    Packs/day: 1.00    Years: 22.00    Pack years: 22.00    Types: Cigarettes    Quit date: 05/07/1997    Years since quitting: 22.0  . Smokeless tobacco: Former Engineer, water and Sexual Activity  . Alcohol use: Yes    Alcohol/week: 1.0 standard drinks    Types: 1 Standard drinks or equivalent per week  . Drug use: No  . Sexual activity: Not on  file  Other Topics Concern  . Not on file  Social History Narrative  . Not on file   Social Determinants of Health   Financial Resource Strain:   . Difficulty of Paying Living Expenses: Not on file  Food Insecurity:   . Worried About Programme researcher, broadcasting/film/video in the Last Year: Not on file  . Ran Out of Food in the Last Year: Not on file  Transportation Needs:   . Lack of Transportation (Medical): Not on file  . Lack of Transportation (Non-Medical): Not on file  Physical Activity:   . Days of Exercise per Week: Not on file  . Minutes of Exercise per Session: Not on file  Stress:   . Feeling of Stress : Not on file  Social Connections:   . Frequency of Communication with Friends  and Family: Not on file  . Frequency of Social Gatherings with Friends and Family: Not on file  . Attends Religious Services: Not on file  . Active Member of Clubs or Organizations: Not on file  . Attends Banker Meetings: Not on file  . Marital Status: Not on file  Intimate Partner Violence:   . Fear of Current or Ex-Partner: Not on file  . Emotionally Abused: Not on file  . Physically Abused: Not on file  . Sexually Abused: Not on file    Family History  Problem Relation Age of Onset  . Cancer Father   . Breast cancer Mother     Review of Systems:  As stated in the HPI and otherwise negative.   BP 128/88   Pulse 82   Ht 5\' 5"  (1.651 m)   Wt 220 lb 12.8 oz (100.2 kg)   SpO2 96%   BMI 36.74 kg/m   Physical Examination: General: Well developed, well nourished, NAD  HEENT: OP clear, mucus membranes moist  SKIN: warm, dry. No rashes. Neuro: No focal deficits  Musculoskeletal: Muscle strength 5/5 all ext  Psychiatric: Mood and affect normal  Neck: No JVD, no carotid bruits, no thyromegaly, no lymphadenopathy.  Lungs:Clear bilaterally, no wheezes, rhonci, crackles Cardiovascular: Regular rate and rhythm. No murmurs, gallops or rubs. Abdomen:Soft. Bowel sounds present. Non-tender.  Extremities: No lower extremity edema. Pulses are 2 + in the bilateral DP/PT.  EKG:  EKG is ordered today. The ekg ordered today demonstrates NSR, rate 82 bpm  Recent Labs: No results found for requested labs within last 8760 hours.   Lipid Panel Followed in primary care   Wt Readings from Last 3 Encounters:  05/11/19 220 lb 12.8 oz (100.2 kg)  02/13/18 218 lb (98.9 kg)  12/19/16 217 lb 3.2 oz (98.5 kg)     Other studies Reviewed: Additional studies/ records that were reviewed today include: . Review of the above records demonstrates:   Assessment and Plan:   1. CAD without angina: He has no chest pain but has had several episodes of sweating while eating. Will continue  statin and beta blocker. He has refused to take an ASA due to issues with an anal fissure. He now agrees to take ASA 81 mg every other day. Will arrange echo to assess LVEF and exercise stress test to exclude ischemia.   2. HTN: BP is controlled. No changes  3. HLD: Will continue statin. Check lipids and LFTs today.   Current medicines are reviewed at length with the patient today.  The patient does not have concerns regarding medicines.  The following changes have been made:  no change  Labs/ tests ordered today include:   Orders Placed This Encounter  Procedures  . Lipid Profile  . Hepatic function panel  . Exercise Tolerance Test  . ECHOCARDIOGRAM COMPLETE    Disposition:   FU with me in  12  months  Signed, Lauree Chandler, MD 05/11/2019 9:46 AM    [facility] [place], [place], [place]  [address] Phone: [phone]; Fax: [phone]

## 2019-05-11 ENCOUNTER — Ambulatory Visit (INDEPENDENT_AMBULATORY_CARE_PROVIDER_SITE_OTHER): Payer: 59 | Admitting: Cardiovascular Disease

## 2019-05-11 ENCOUNTER — Other Ambulatory Visit: Payer: Self-pay

## 2019-05-11 ENCOUNTER — Encounter: Payer: Self-pay | Admitting: Cardiovascular Disease

## 2019-05-11 VITALS — BP 128/88 | HR 82 | Ht 65.0 in | Wt 220.8 lb

## 2019-05-11 DIAGNOSIS — I25810 Atherosclerosis of coronary artery bypass graft(s) without angina pectoris: Secondary | ICD-10-CM

## 2019-05-11 DIAGNOSIS — I10 Essential (primary) hypertension: Secondary | ICD-10-CM | POA: Diagnosis not present

## 2019-05-11 DIAGNOSIS — E7800 Pure hypercholesterolemia, unspecified: Secondary | ICD-10-CM | POA: Diagnosis not present

## 2019-05-11 LAB — LIPID PANEL
Chol/HDL Ratio: 3.4 ratio (ref 0.0–5.0)
Cholesterol, Total: 161 mg/dL (ref 100–199)
HDL: 48 mg/dL (ref >39)
LDL Chol Calc (NIH): 84 mg/dL (ref 0–99)
Triglycerides: 168 mg/dL — ABNORMAL HIGH (ref 0–149)
VLDL Cholesterol Cal: 29 mg/dL (ref 5–40)

## 2019-05-11 LAB — HEPATIC FUNCTION PANEL
ALT: 51 IU/L — ABNORMAL HIGH (ref 0–44)
AST: 32 IU/L (ref 0–40)
Albumin: 4.7 g/dL (ref 3.8–4.9)
Alkaline Phosphatase: 68 IU/L (ref 39–117)
Bilirubin Total: 0.6 mg/dL (ref 0.0–1.2)
Bilirubin, Direct: 0.14 mg/dL (ref 0.00–0.40)
Total Protein: 7.2 g/dL (ref 6.0–8.5)

## 2019-05-11 MED ORDER — ASPIRIN EC 81 MG PO TBEC: 81.0000 mg | DELAYED_RELEASE_TABLET | ORAL | 3 refills | Status: AC

## 2019-05-11 NOTE — Addendum Note (Signed)
Addended by: Melanee Spry on: 05/11/2019 10:33 AM   Modules accepted: Orders

## 2019-05-11 NOTE — Patient Instructions (Signed)
Medication Instructions:  Your physician has recommended you make the following change in your medication:  1.) start aspirin 81 mg (enteric coated=EC) once every other day  *If you need a refill on your cardiac medications before your next appointment, please call your pharmacy*  Lab Work: Today: lipids/liver panel If you have labs (blood work) drawn today and your tests are completely normal, you will receive your results only by: Marland Kitchen MyChart Message (if you have MyChart) OR . A paper copy in the mail If you have any lab test that is abnormal or we need to change your treatment, we will call you to review the results.  Testing/Procedures: Your physician has requested that you have an echocardiogram. Echocardiography is a painless test that uses sound waves to create images of your heart. It provides your doctor with information about the size and shape of your heart and how well your heart's chambers and valves are working. This procedure takes approximately one hour. There are no restrictions for this procedure.  Your physician has requested that you have an exercise tolerance test. For further information please visit [email or web address] Please also follow instruction sheet, as given. You will be scheduled for Covid screening test prior to this study.  Once screening is complete, please stay at home and only go out to medical appointments until you come in for the treadmill test.   Follow-Up: At [facility], you and your health needs are our priority.  As part of our continuing mission to provide you with exceptional heart care, we have created designated Provider Care Teams.  These Care Teams include your primary Cardiologist (physician) and Advanced Practice Providers (APPs -  Physician Assistants and Nurse Practitioners) who all work together to provide you with the care you need, when you need it.  Your next appointment:   12 month(s)  The format for your next appointment:   In  person  Provider:   Verne Carrow, MD  Other Instructions

## 2019-05-15 ENCOUNTER — Telehealth: Payer: Self-pay | Admitting: *Deleted

## 2019-05-15 DIAGNOSIS — I25810 Atherosclerosis of coronary artery bypass graft(s) without angina pectoris: Secondary | ICD-10-CM

## 2019-05-15 MED ORDER — ATORVASTATIN CALCIUM 80 MG PO TABS: 80.0000 mg | ORAL_TABLET | Freq: Every day | ORAL | 3 refills | Status: DC

## 2019-05-15 NOTE — Telephone Encounter (Signed)
-----   Message from Kathleene Hazel, MD sent at 05/12/2019  4:14 PM EST ----- LDL not at goal. Would advise increasing Lipitor to 80 mg daily, repeat lipids and LFTs in 12 weeks. LFTs slightly abnormal chronically. No change. cdm

## 2019-05-15 NOTE — Telephone Encounter (Signed)
Patient aware of results and recommendations and is in agreement.  New prescription sent to [facility].

## 2019-05-29 ENCOUNTER — Other Ambulatory Visit (HOSPITAL_COMMUNITY)
Admission: RE | Admit: 2019-05-29 | Discharge: 2019-05-29 | Disposition: A | Discharge status: Home / Self Care | Payer: 59 | Source: Ambulatory Visit | Attending: Cardiovascular Disease | Admitting: Cardiovascular Disease

## 2019-05-29 DIAGNOSIS — Z01812 Encounter for preprocedural laboratory examination: Secondary | ICD-10-CM | POA: Diagnosis not present

## 2019-05-29 DIAGNOSIS — Z20822 Contact with and (suspected) exposure to covid-19: Secondary | ICD-10-CM | POA: Diagnosis not present

## 2019-05-29 LAB — SARS CORONAVIRUS 2 (TAT 6-24 HRS): SARS Coronavirus 2: NEGATIVE

## 2019-06-02 ENCOUNTER — Ambulatory Visit (HOSPITAL_COMMUNITY): Payer: 59 | Attending: Cardiovascular Disease

## 2019-06-02 ENCOUNTER — Ambulatory Visit (INDEPENDENT_AMBULATORY_CARE_PROVIDER_SITE_OTHER): Payer: 59

## 2019-06-02 ENCOUNTER — Other Ambulatory Visit: Payer: Self-pay

## 2019-06-02 DIAGNOSIS — I10 Essential (primary) hypertension: Secondary | ICD-10-CM | POA: Diagnosis not present

## 2019-06-02 DIAGNOSIS — I25810 Atherosclerosis of coronary artery bypass graft(s) without angina pectoris: Secondary | ICD-10-CM | POA: Insufficient documentation

## 2019-06-02 DIAGNOSIS — E7800 Pure hypercholesterolemia, unspecified: Secondary | ICD-10-CM | POA: Diagnosis not present

## 2019-06-02 LAB — EXERCISE TOLERANCE TEST
Estimated workload: 10.1 METS
Exercise duration (min): 8 min
Exercise duration (sec): 0 s
MPHR: 164 {beats}/min
Peak HR: 155 {beats}/min
Percent HR: 94 %
RPE: 16
Rest HR: 86 {beats}/min

## 2019-08-05 ENCOUNTER — Telehealth: Payer: Self-pay | Admitting: Cardiovascular Disease

## 2019-08-05 NOTE — Telephone Encounter (Signed)
Patient due for lipids/liver function after increasing Lipitor to  80 mg in January.  If labs from PCP included lipids/liver, we can just request a copy of these for Dr. Clifton James to review.  Left message for patient to call back to discuss.

## 2019-08-05 NOTE — Telephone Encounter (Signed)
New message:    Patient stating that he would like for some one to call him concering the lab appt, patient states he just had some test done and had a psychical with his primary doctor. Please call patient back

## 2019-08-05 NOTE — Telephone Encounter (Signed)
Patient returned my call. 07/20/19 PCP did labs. He has an appointment with her tomorrow. Copy of this requested from Dr. Mathews Robinsons office for Dr. Gibson Ramp review of lipids/liver on increased dose of Lipitor. Lab appointment for 08/06/19 cancelled.

## 2019-08-06 ENCOUNTER — Telehealth: Payer: Self-pay

## 2019-08-06 NOTE — Telephone Encounter (Signed)
...  NOTES ON FILE FROM DR Andi Devon [phone], SENT REFERRAL TO SCHEDULING

## 2019-08-07 ENCOUNTER — Other Ambulatory Visit: Payer: 59

## 2020-06-03 ENCOUNTER — Other Ambulatory Visit: Payer: Self-pay | Admitting: Cardiovascular Disease

## 2020-06-06 ENCOUNTER — Other Ambulatory Visit: Payer: Self-pay | Admitting: Cardiovascular Disease

## 2020-06-16 ENCOUNTER — Telehealth: Payer: Self-pay | Admitting: Cardiovascular Disease

## 2020-06-17 NOTE — Telephone Encounter (Signed)
I did not need this encounter. °

## 2020-06-19 NOTE — Progress Notes (Signed)
Chief Complaint  Patient presents with  . Follow-up    CAD   History of Present Illness: 60 yo male with history of CAD s/p CABG, HTN, HLD here today for cardiac followup. He was admitted in September 2012 with an inferolateral ST elevation myocardial infarction. He suffered V. Fib arrest in the emergency room and required defibrillation x2 and intubation. Cardiac cath 02/02/11 with 70-80% distal left main stenosis, 90% ostial LAD stenosis, occluded proximal Circumflex artery. He underwent PCI with thrombectomy and balloon angioplasty of the occluded circumflex. He was then taken for emergent bypass surgery. (LIMA to LAD, SVG to Diagonal, SVG to OM1&OM2). Echo October 2012 with mild LVH, LVEF 65%, mild MR. He underwent a stress myoview at [facility] November 2013 and there was no ischemia. His LVEF was 65%. Exercise stress test June 2016 with no ischemia. Echo January 2021 with LVEF=55-60%, no significant valve disease. Exercise stress test in January 2021 with no ischemia.   He is here today for follow up. The patient denies any chest pain, dyspnea, palpitations, lower extremity edema, orthopnea, PND, dizziness, near syncope or syncope.   Primary Care Physician: Andi Devon, MD  Past Medical History:  Diagnosis Date  . CAD (coronary artery disease)    IL STEMI 9/12:  Cardiac catheterization 02/02/11: dLM 70-80%, oLAD 90%, pCFX occluded with L-R collats, EF 40% with inf HK.  He underwent salvage PCI with thrombectomy and balloon angioplasty of the occluded circumflex.  He was then taken for emergent bypass.  Grafts included L-LAD, S-Dx, S-OM1&OM2  . HLD (hyperlipidemia)   . HTN (hypertension)   . Obesity     Past Surgical History:  Procedure Laterality Date  . CARDIAC CATHETERIZATION  02/02/11  . CORONARY ARTERY BYPASS GRAFT  02/03/11  . Dental surgery on tooth that did not descend    . WISDOM TOOTH EXTRACTION      Current Outpatient Medications  Medication Sig Dispense Refill  .  ascorbic acid (VITAMIN C) 1000 MG tablet Take 1,000 mg by mouth daily.    Marland Kitchen aspirin EC 81 MG tablet Take 1 tablet (81 mg total) by mouth every other day. 90 tablet 3  . atorvastatin (LIPITOR) 80 MG tablet TAKE 1 TABLET BY MOUTH EVERY DAY 30 tablet 0  . cetirizine (ZYRTEC) 10 MG tablet Take 10 mg by mouth daily.    . Coenzyme Q10 300 MG CAPS Take 300 mg by mouth daily.    . metoprolol tartrate (LOPRESSOR) 25 MG tablet Take 0.5 tablets (12.5 mg total) by mouth 2 (two) times daily. 90 tablet 3   No current facility-administered medications for this visit.    No Known Allergies  Social History   Socioeconomic History  . Marital status: Married    Spouse name: Not on file  . Number of children: 0  . Years of education: Not on file  . Highest education level: Not on file  Occupational History  . Occupation: Armed forces technical officer: [employer]  Tobacco Use  . Smoking status: Former Smoker    Packs/day: 1.00    Years: 22.00    Pack years: 22.00    Types: Cigarettes    Quit date: 05/07/1997    Years since quitting: 23.1  . Smokeless tobacco: Former Engineer, water and Sexual Activity  . Alcohol use: Yes    Alcohol/week: 1.0 standard drink    Types: 1 Standard drinks or equivalent per week  . Drug use: No  . Sexual activity: Not  on file  Other Topics Concern  . Not on file  Social History Narrative  . Not on file   Social Determinants of Health   Financial Resource Strain: Not on file  Food Insecurity: Not on file  Transportation Needs: Not on file  Physical Activity: Not on file  Stress: Not on file  Social Connections: Not on file  Intimate Partner Violence: Not on file    Family History  Problem Relation Age of Onset  . Cancer Father   . Breast cancer Mother     Review of Systems:  As stated in the HPI and otherwise negative.   BP 122/74   Pulse 73   Ht 5\' 5"  (1.651 m)   Wt 207 lb 3.2 oz (94 kg)   SpO2 99%   BMI 34.48 kg/m    Physical Examination: General: Well developed, well nourished, NAD  HEENT: OP clear, mucus membranes moist  SKIN: warm, dry. No rashes. Neuro: No focal deficits  Musculoskeletal: Muscle strength 5/5 all ext  Psychiatric: Mood and affect normal  Neck: No JVD, no carotid bruits, no thyromegaly, no lymphadenopathy.  Lungs:Clear bilaterally, no wheezes, rhonci, crackles Cardiovascular: Regular rate and rhythm. No murmurs, gallops or rubs. Abdomen:Soft. Bowel sounds present. Non-tender.  Extremities: No lower extremity edema. Pulses are 2 + in the bilateral DP/PT.  EKG:  EKG is ordered today. The ekg ordered today demonstrates sinus  Echo January 2021: 1. Left ventricular ejection fraction, by visual estimation, is 55 to  60%. The left ventricle has normal function. There is no left ventricular  hypertrophy.  2. The left ventricle has no regional wall motion abnormalities.  3. Global right ventricle has mildly reduced systolic function.The right  ventricular size is mildly enlarged. No increase in right ventricular wall  thickness.  4. Left atrial size was mildly dilated.  5. Right atrial size was normal.  6. The mitral valve is normal in structure. Trivial mitral valve  regurgitation. No evidence of mitral stenosis.  7. The tricuspid valve is normal in structure.  8. The tricuspid valve is normal in structure. Tricuspid valve  regurgitation is mild.  9. The aortic valve is tricuspid. Aortic valve regurgitation is not  visualized. Mild aortic valve sclerosis without stenosis.  10. The pulmonic valve was normal in structure. Pulmonic valve  regurgitation is not visualized.  11. The inferior vena cava is normal in size with greater than 50%  respiratory variability, suggesting right atrial pressure of 3 mmHg.   Recent Labs: No results found for requested labs within last 8760 hours.   Lipid Panel Followed in primary care Lipid Panel     Component Value Date/Time    CHOL 161 05/11/2019 0957   CHOL 143 04/06/2012 0047   TRIG 168 (H) 05/11/2019 0957   TRIG 145 04/06/2012 0047   HDL 48 05/11/2019 0957   HDL 36 (L) 04/06/2012 0047   CHOLHDL 3.4 05/11/2019 0957   VLDL 29 04/06/2012 0047   LDLCALC 84 05/11/2019 0957   LDLCALC 78 04/06/2012 0047   LABVLDL 29 05/11/2019 0957     Wt Readings from Last 3 Encounters:  06/20/20 207 lb 3.2 oz (94 kg)  05/11/19 220 lb 12.8 oz (100.2 kg)  02/13/18 218 lb (98.9 kg)     Other studies Reviewed: Additional studies/ records that were reviewed today include: . Review of the above records demonstrates:   Assessment and Plan:   1. CAD s/p CABG without angina: No chest pain. Stress test normal in January  2021. Echo January 2021 with normal LV systolic function. Continue ASA, beta blocker and statin.    2. HTN: BP is well controlled. No changes today  3. HLD: LDL near goal in 2021. Continue statin. Check lipids and LFTs in his annual physical in March in primary care. He requests this.   Current medicines are reviewed at length with the patient today.  The patient does not have concerns regarding medicines.  The following changes have been made:  no change  Labs/ tests ordered today include:   Orders Placed This Encounter  Procedures  . EKG 12-Lead    Disposition:   FU with me in  12  months  Signed, Verne Carrow, MD 06/20/2020 8:56 AM    [facility] [address] [place], [place], [place]  [address] Phone: [phone]; Fax: [phone]

## 2020-06-20 ENCOUNTER — Ambulatory Visit (INDEPENDENT_AMBULATORY_CARE_PROVIDER_SITE_OTHER): Payer: 59 | Admitting: Cardiovascular Disease

## 2020-06-20 ENCOUNTER — Other Ambulatory Visit: Payer: Self-pay

## 2020-06-20 ENCOUNTER — Encounter: Payer: Self-pay | Admitting: Cardiovascular Disease

## 2020-06-20 VITALS — BP 122/74 | HR 73 | Ht 65.0 in | Wt 207.2 lb

## 2020-06-20 DIAGNOSIS — E7800 Pure hypercholesterolemia, unspecified: Secondary | ICD-10-CM | POA: Diagnosis not present

## 2020-06-20 DIAGNOSIS — I25810 Atherosclerosis of coronary artery bypass graft(s) without angina pectoris: Secondary | ICD-10-CM

## 2020-06-20 DIAGNOSIS — I10 Essential (primary) hypertension: Secondary | ICD-10-CM

## 2020-06-20 NOTE — Patient Instructions (Addendum)

## 2021-07-03 NOTE — Progress Notes (Signed)
Chief Complaint  Patient presents with   Follow-up    CAD   History of Present Illness: 61 yo male with history of CAD s/p CABG, HTN, HLD here today for cardiac followup. He was admitted in September 2012 with an inferolateral ST elevation myocardial infarction. He suffered V. Fib arrest in the emergency room and required defibrillation x2 and intubation. Cardiac cath 02/02/11 with 70-80% distal left main stenosis, 90% ostial LAD stenosis, occluded proximal Circumflex artery. He underwent PCI with thrombectomy and balloon angioplasty of the occluded circumflex. He was then taken for emergent bypass surgery. (LIMA to LAD, SVG to Diagonal, SVG to OM1&OM2). Echo October 2012 with mild LVH, LVEF 65%, mild MR. He underwent a stress myoview at [facility] November 2013 and there was no ischemia. His LVEF was 65%. Exercise stress test June 2016 with no ischemia. Echo January 2021 with LVEF=55-60%, no significant valve disease. Exercise stress test in January 2021 with no ischemia.   He is here today for follow up. The patient denies any chest pain, dyspnea, palpitations, lower extremity edema, orthopnea, PND, dizziness, near syncope or syncope.   Primary Care Physician: Andi Devon, MD  Past Medical History:  Diagnosis Date   CAD (coronary artery disease)    IL STEMI 9/12:  Cardiac catheterization 02/02/11: dLM 70-80%, oLAD 90%, pCFX occluded with L-R collats, EF 40% with inf HK.  He underwent salvage PCI with thrombectomy and balloon angioplasty of the occluded circumflex.  He was then taken for emergent bypass.  Grafts included L-LAD, S-Dx, S-OM1&OM2   HLD (hyperlipidemia)    HTN (hypertension)    Obesity     Past Surgical History:  Procedure Laterality Date   CARDIAC CATHETERIZATION  02/02/11   CORONARY ARTERY BYPASS GRAFT  02/03/11   Dental surgery on tooth that did not descend     WISDOM TOOTH EXTRACTION      Current Outpatient Medications  Medication Sig Dispense Refill   ascorbic acid  (VITAMIN C) 1000 MG tablet Take 1,000 mg by mouth daily.     aspirin EC 81 MG tablet Take 1 tablet (81 mg total) by mouth every other day. 90 tablet 3   cetirizine (ZYRTEC) 10 MG tablet Take 10 mg by mouth daily.     Coenzyme Q10 300 MG CAPS Take 300 mg by mouth daily.     metoprolol tartrate (LOPRESSOR) 25 MG tablet Take 0.5 tablets (12.5 mg total) by mouth 2 (two) times daily. 90 tablet 3   atorvastatin (LIPITOR) 40 MG tablet Take 40 mg by mouth daily.     No current facility-administered medications for this visit.    No Known Allergies  Social History   Socioeconomic History   Marital status: Married    Spouse name: Not on file   Number of children: 0   Years of education: Not on file   Highest education level: Not on file  Occupational History   Occupation: Information Technology    Employer: [employer]  Tobacco Use   Smoking status: Former    Packs/day: 1.00    Years: 22.00    Pack years: 22.00    Types: Cigarettes    Quit date: 05/07/1997    Years since quitting: 24.1   Smokeless tobacco: Former  Substance and Sexual Activity   Alcohol use: Yes    Alcohol/week: 1.0 standard drink    Types: 1 Standard drinks or equivalent per week   Drug use: No   Sexual activity: Not on file  Other  Topics Concern   Not on file  Social History Narrative   Not on file   Social Determinants of Health   Financial Resource Strain: Not on file  Food Insecurity: Not on file  Transportation Needs: Not on file  Physical Activity: Not on file  Stress: Not on file  Social Connections: Not on file  Intimate Partner Violence: Not on file    Family History  Problem Relation Age of Onset   Cancer Father    Breast cancer Mother     Review of Systems:  As stated in the HPI and otherwise negative.   BP (!) 146/78    Pulse 84    Ht 5\' 5"  (1.651 m)    Wt 218 lb 9.6 oz (99.2 kg)    SpO2 97%    BMI 36.38 kg/m   Physical Examination:  General: Well developed, well  nourished, NAD  HEENT: OP clear, mucus membranes moist  SKIN: warm, dry. No rashes. Neuro: No focal deficits  Musculoskeletal: Muscle strength 5/5 all ext  Psychiatric: Mood and affect normal  Neck: No JVD, no carotid bruits, no thyromegaly, no lymphadenopathy.  Lungs:Clear bilaterally, no wheezes, rhonci, crackles Cardiovascular: Regular rate and rhythm. No murmurs, gallops or rubs. Abdomen:Soft. Bowel sounds present. Non-tender.  Extremities: No lower extremity edema. Pulses are 2 + in the bilateral DP/PT.  EKG:  EKG is ordered today. The ekg ordered today demonstrates sinus  Echo January 2021:  1. Left ventricular ejection fraction, by visual estimation, is 55 to  60%. The left ventricle has normal function. There is no left ventricular  hypertrophy.   2. The left ventricle has no regional wall motion abnormalities.   3. Global right ventricle has mildly reduced systolic function.The right  ventricular size is mildly enlarged. No increase in right ventricular wall  thickness.   4. Left atrial size was mildly dilated.   5. Right atrial size was normal.   6. The mitral valve is normal in structure. Trivial mitral valve  regurgitation. No evidence of mitral stenosis.   7. The tricuspid valve is normal in structure.   8. The tricuspid valve is normal in structure. Tricuspid valve  regurgitation is mild.   9. The aortic valve is tricuspid. Aortic valve regurgitation is not  visualized. Mild aortic valve sclerosis without stenosis.  10. The pulmonic valve was normal in structure. Pulmonic valve  regurgitation is not visualized.  11. The inferior vena cava is normal in size with greater than 50%  respiratory variability, suggesting right atrial pressure of 3 mmHg.   Recent Labs: No results found for requested labs within last 8760 hours.   Lipid Panel Followed in primary care Lipid Panel     Component Value Date/Time   CHOL 161 05/11/2019 0957   CHOL 143 04/06/2012 0047    TRIG 168 (H) 05/11/2019 0957   TRIG 145 04/06/2012 0047   HDL 48 05/11/2019 0957   HDL 36 (L) 04/06/2012 0047   CHOLHDL 3.4 05/11/2019 0957   VLDL 29 04/06/2012 0047   LDLCALC 84 05/11/2019 0957   LDLCALC 78 04/06/2012 0047   LABVLDL 29 05/11/2019 0957     Wt Readings from Last 3 Encounters:  07/04/21 218 lb 9.6 oz (99.2 kg)  06/20/20 207 lb 3.2 oz (94 kg)  05/11/19 220 lb 12.8 oz (100.2 kg)     Other studies Reviewed: Additional studies/ records that were reviewed today include: . Review of the above records demonstrates:   Assessment and Plan:  1. CAD s/p CABG without angina: He has no chest pain. Stress test normal in January 2021. Echo January 2021 with normal LV systolic function. Will continue ASA, statin and beta blocker.   2. HTN: BP is elevated today and has been at home.  We have discussed increasing his Lopressor to 25 mg po BID but he wishes to follow at home and attempt to lose some weight. Continue current therapy  3. HLD: LDL near goal in 2021. Continue statin. Will repeat lipids and LFTS in primary care this week.  Current medicines are reviewed at length with the patient today.  The patient does not have concerns regarding medicines.  The following changes have been made:  no change  Labs/ tests ordered today include:   Orders Placed This Encounter  Procedures   EKG 12-Lead    Disposition:   F/U with me in  12  months  Signed, Verne Carrow, MD 07/04/2021 12:27 PM    [facility] [address] [place], [place], [place]  [address] Phone: [phone]; Fax: [phone]

## 2021-07-04 ENCOUNTER — Ambulatory Visit (INDEPENDENT_AMBULATORY_CARE_PROVIDER_SITE_OTHER): Payer: 59 | Admitting: Cardiovascular Disease

## 2021-07-04 ENCOUNTER — Other Ambulatory Visit: Payer: Self-pay

## 2021-07-04 ENCOUNTER — Encounter: Payer: Self-pay | Admitting: Cardiovascular Disease

## 2021-07-04 VITALS — BP 146/78 | HR 84 | Ht 65.0 in | Wt 218.6 lb

## 2021-07-04 DIAGNOSIS — I10 Essential (primary) hypertension: Secondary | ICD-10-CM

## 2021-07-04 DIAGNOSIS — E7800 Pure hypercholesterolemia, unspecified: Secondary | ICD-10-CM

## 2021-07-04 DIAGNOSIS — I25810 Atherosclerosis of coronary artery bypass graft(s) without angina pectoris: Secondary | ICD-10-CM | POA: Diagnosis not present

## 2021-07-04 NOTE — Patient Instructions (Signed)

## 2021-11-17 ENCOUNTER — Ambulatory Visit: Payer: 59 | Admitting: Cardiovascular Disease

## 2022-04-03 ENCOUNTER — Telehealth: Payer: Self-pay

## 2022-04-03 NOTE — Telephone Encounter (Signed)
   Pre-operative Risk Assessment    Patient Name: [name]  DOB: October 13, 1960 MRN: [id number]     Request for Surgical Clearance    Procedure:  Colonoscopy   Date of Surgery:  Clearance 06/18/2022                       Surgeon's Group or Practice Name:  [facility]  Phone number:  [phone] Fax number:  [phone]   Type of Clearance Requested:   - Medical    Type of Anesthesia:   Propofol    Additional requests/questions:    Scarlette Shorts   04/03/2022, 3:36 PM

## 2022-04-04 NOTE — Telephone Encounter (Signed)
   Name: [name]  DOB: May 06, 1961  MRN: [id number]  Primary Cardiologist: Verne Carrow, MD  Chart reviewed as part of pre-operative protocol coverage. Because of [name]'s past medical history and time since last visit, he will require a follow-up in-office visit in order to better assess preoperative cardiovascular risk.  Pre-op covering staff: - Please schedule appointment and call patient to inform them. If patient already had an upcoming appointment within acceptable timeframe, please add "pre-op clearance" to the appointment notes so provider is aware. - Please contact requesting surgeon's office via preferred method (i.e, phone, fax) to inform them of need for appointment prior to surgery.   Alver Sorrow, NP  04/04/2022, 8:53 AM

## 2022-04-05 NOTE — Telephone Encounter (Signed)
Lvm for pt to schedule appt for Jan or Feb for surg clearance.

## 2022-04-06 NOTE — Telephone Encounter (Signed)
Pt has appt 06/04/22 with Tereso Newcomer, PAC for pre op clearance.

## 2022-06-04 ENCOUNTER — Ambulatory Visit: Payer: 59 | Admitting: Physician Assistant

## 2022-06-06 ENCOUNTER — Encounter: Payer: Self-pay | Admitting: Cardiology

## 2022-06-06 ENCOUNTER — Ambulatory Visit: Payer: No Typology Code available for payment source | Attending: Physician Assistant | Admitting: Cardiology

## 2022-06-06 VITALS — BP 148/78 | HR 96 | Ht 65.0 in | Wt 222.6 lb

## 2022-06-06 DIAGNOSIS — R0683 Snoring: Secondary | ICD-10-CM

## 2022-06-06 DIAGNOSIS — E7800 Pure hypercholesterolemia, unspecified: Secondary | ICD-10-CM

## 2022-06-06 DIAGNOSIS — I10 Essential (primary) hypertension: Secondary | ICD-10-CM | POA: Diagnosis not present

## 2022-06-06 DIAGNOSIS — Z01818 Encounter for other preprocedural examination: Secondary | ICD-10-CM

## 2022-06-06 DIAGNOSIS — I25810 Atherosclerosis of coronary artery bypass graft(s) without angina pectoris: Secondary | ICD-10-CM

## 2022-06-06 MED ORDER — METOPROLOL TARTRATE 25 MG PO TABS: 25.0000 mg | ORAL_TABLET | Freq: Two times a day (BID) | ORAL | 3 refills | Status: AC

## 2022-06-06 NOTE — Patient Instructions (Signed)
Medication Instructions:  Your physician has recommended you make the following change in your medication:   INCREASE the Metoprolol 25 taking 1 tablet twice a day   *If you need a refill on your cardiac medications before your next appointment, please call your pharmacy*   Lab Work: None ordered  If you have labs (blood work) drawn today and your tests are completely normal, you will receive your results only by: [place] (if you have MyChart) OR A paper copy in the mail If you have any lab test that is abnormal or we need to change your treatment, we will call you to review the results.   Testing/Procedures: None ordered   Follow-Up: At [facility], you and your health needs are our priority.  As part of our continuing mission to provide you with exceptional heart care, we have created designated Provider Care Teams.  These Care Teams include your primary Cardiologist (physician) and Advanced Practice Providers (APPs -  Physician Assistants and Nurse Practitioners) who all work together to provide you with the care you need, when you need it.  We recommend signing up for the patient portal called "MyChart".  Sign up information is provided on this After Visit Summary.  MyChart is used to connect with patients for Virtual Visits (Telemedicine).  Patients are able to view lab/test results, encounter notes, upcoming appointments, etc.  Non-urgent messages can be sent to your provider as well.   To learn more about what you can do with MyChart, go to [email or web address].    Your next appointment:   12 month(s)  Provider:   Lauree Chandler, MD     Other Instructions

## 2022-06-06 NOTE — Progress Notes (Signed)
Cardiology Office Note:    Date:  06/06/2022   ID:  Achilles, [name] Oct 15, 1960, MRN [id number]  PCP:  Andi Devon, MD   [facility] Providers Cardiologist:  Verne Carrow, MD     Referring MD: Andi Devon, MD   Chief Complaint  Patient presents with   Follow-up    Seen for [name]     History of Present Illness:    [name] is a 62 y.o. male with a hx of CAD (2012 VF arrest defibrillation x2, PCI with thrombectomy and balloon angioplasty of the occluded circumflex) > emergent CABG (2012 LIMA to LAD, SVG to Diagonal, SVG to OM1&OM2), HTN, and HLD.  Last seen in our office on 07/04/21 with [name], at that time he was doing well. BP was elevated but he wanted to try to lose some weight as opposed to adjusting his lopressor.   He presents today for a follow-up visit as well as preoperative clearance for upcoming colonoscopy.  He states overall he is doing well, he has had a lot of stress at work and is frequently working 60 hours/week.  As a result of this he has not been able to focus on lifestyle changes to help lose weight and decrease his blood pressure. He denies chest pain, palpitations, dyspnea, pnd, orthopnea, n, v, dizziness, syncope, edema, weight gain, or early satiety.  His blood pressure is elevated in the office today initially 144/84, upon recheck it was 148/78. We discussed sleep apnea, he says he does snore, discussed risk factors associated with untreated OSA, but for now he does not want to proceed with further work up.    Past Medical History:  Diagnosis Date   CAD (coronary artery disease)    IL STEMI 9/12:  Cardiac catheterization 02/02/11: dLM 70-80%, oLAD 90%, pCFX occluded with L-R collats, EF 40% with inf HK.  He underwent salvage PCI with thrombectomy and balloon angioplasty of the occluded circumflex.  He was then taken for emergent bypass.  Grafts included L-LAD, S-Dx, S-OM1&OM2   HLD (hyperlipidemia)    HTN  (hypertension)    Obesity     Past Surgical History:  Procedure Laterality Date   CARDIAC CATHETERIZATION  02/02/11   CORONARY ARTERY BYPASS GRAFT  02/03/11   Dental surgery on tooth that did not descend     WISDOM TOOTH EXTRACTION      Current Medications: Current Meds  Medication Sig   ascorbic acid (VITAMIN C) 1000 MG tablet Take 1,000 mg by mouth daily.   aspirin EC 81 MG tablet Take 1 tablet (81 mg total) by mouth every other day.   atorvastatin (LIPITOR) 40 MG tablet Take 40 mg by mouth daily.   cetirizine (ZYRTEC) 10 MG tablet Take 10 mg by mouth daily.   Cholecalciferol (VITAMIN D3) 50 MCG (2000 UT) TABS Take 1 tablet by mouth daily at 6 (six) AM.   Coenzyme Q10 300 MG CAPS Take 300 mg by mouth daily.   metoprolol tartrate (LOPRESSOR) 25 MG tablet Take 1 tablet (25 mg total) by mouth 2 (two) times daily.   [DISCONTINUED] metoprolol tartrate (LOPRESSOR) 25 MG tablet Take 0.5 tablets (12.5 mg total) by mouth 2 (two) times daily.     Allergies:   Patient has no known allergies.   Social History   Socioeconomic History   Marital status: Married    Spouse name: Not on file   Number of children: 0   Years of education: Not on file  Highest education level: Not on file  Occupational History   Occupation: Information Technology    Employer: [employer]  Tobacco Use   Smoking status: Former    Packs/day: 1.00    Years: 22.00    Total pack years: 22.00    Types: Cigarettes    Quit date: 05/07/1997    Years since quitting: 25.0   Smokeless tobacco: Former  Substance and Sexual Activity   Alcohol use: Yes    Alcohol/week: 1.0 standard drink of alcohol    Types: 1 Standard drinks or equivalent per week   Drug use: No   Sexual activity: Not on file  Other Topics Concern   Not on file  Social History Narrative   Not on file   Social Determinants of Health   Financial Resource Strain: Not on file  Food Insecurity: Not on file  Transportation Needs: Not  on file  Physical Activity: Not on file  Stress: Not on file  Social Connections: Not on file     Family History: The patient's family history includes Breast cancer in his mother; Cancer in his father.  ROS:   Please see the history of present illness.    All other systems reviewed and are negative.  EKGs/Labs/Other Studies Reviewed:    The following studies were reviewed today:  06/02/19 ETT -  The patient walked on a Bruce protocol treadmill test for a total of 8 minutes. His peak heart rate is 155 which is 94% predicted maximal heart rate. His blood pressure response to exercise was hypertensive. At peak exercise there were no ST or T wave changes to suggest ischemia. There is no QRS widening At peak exercise. This is interpreted as a negative stress test. There is no evidence of ischemia or inducible arrhythmias.  06/02/19 echo complete - EF 55-60%, RV mildly enlarged, LA mildly dilated, mild TR, mild aortic valve sclerosis without stenosis  EKG:  EKG is ordered today.  The ekg ordered today demonstrates SR, HR 96 bpm, previous inferior infarct consistent with prior EKG tracings.   Recent Labs: No results found for requested labs within last 365 days.  Recent Lipid Panel    Component Value Date/Time   CHOL 161 05/11/2019 0957   CHOL 143 04/06/2012 0047   TRIG 168 (H) 05/11/2019 0957   TRIG 145 04/06/2012 0047   HDL 48 05/11/2019 0957   HDL 36 (L) 04/06/2012 0047   CHOLHDL 3.4 05/11/2019 0957   VLDL 29 04/06/2012 0047   LDLCALC 84 05/11/2019 0957   LDLCALC 78 04/06/2012 0047     Risk Assessment/Calculations:      HYPERTENSION CONTROL Vitals:   06/06/22 1507 06/06/22 1540  BP: (!) 144/84 (!) 148/78    The patient's blood pressure is elevated above target today.  In order to address the patient's elevated BP: A current anti-hypertensive medication was adjusted today.       STOP-Bang Score:  6       Physical Exam:    VS:  BP (!) 148/78   Pulse 96   Ht  5\' 5"  (1.651 m)   Wt 222 lb 9.6 oz (101 kg)   SpO2 96%   BMI 37.04 kg/m     Wt Readings from Last 3 Encounters:  06/06/22 222 lb 9.6 oz (101 kg)  07/04/21 218 lb 9.6 oz (99.2 kg)  06/20/20 207 lb 3.2 oz (94 kg)     GEN:  Well nourished, well developed in no acute distress HEENT: Normal NECK:  No JVD; No carotid bruits LYMPHATICS: No lymphadenopathy CARDIAC: RRR, no murmurs, rubs, gallops RESPIRATORY:  Clear to auscultation without rales, wheezing or rhonchi  ABDOMEN: Soft, non-tender, non-distended MUSCULOSKELETAL:  No edema; No deformity  SKIN: Warm and dry NEUROLOGIC:  Alert and oriented x 3 PSYCHIATRIC:  Normal affect   ASSESSMENT:    1. Coronary artery disease involving coronary bypass graft of native heart without angina pectoris   2. Essential hypertension   3. Pure hypercholesterolemia   4. Snoring   5. Preoperative clearance    PLAN:    In order of problems listed above:  CAD - 2012 VF arrest defibrillation x2, PCI with thrombectomy and balloon angioplasty of the occluded circumflex) > emergent CABG (2012 LIMA to LAD, SVG to Diagonal, SVG to OM1&OM2). Denies CP or acute decompensation. Continue ASA, metoprolol, and atorvastatin.  HTN - BP today 144/84, rechecked 148/78. Was elevated at his last appointment as well, however he opted to try to lose weight and increase physical activity. He is agreeable to increase his metoprolol to 25 mg twice/day.  HLD - Managed by his PCP, no records to review, will call his PCP and request results. Continue atorvastatin. Snoring - STOP-Bang 6. Discussed at home sleep apnea, however he wants some time to consider this.  Preoperative clearance -   According to the Revised Cardiac Risk Index (RCRI), his Perioperative Risk of Major Cardiac Event is (%): 0.9 His Functional Capacity in METs is: 8.97 according to the Duke Activity Status Index (DASI).   Therefore, based on ACC/AHA guidelines, patient would be at acceptable risk for the  planned procedure without further cardiovascular testing. I will route this recommendation to the requesting party via Epic fax function.   Regarding his aspirin therapy, it is preferred that he continue aspirin, however if the bleeding risk is too great, he can hold aspirin for 7 days and resume when advised so by surgeon.   Disposition - increase metoprolol to 25 mg twice daily, return in 1 year to see Dr. Angelena Form.       Medication Adjustments/Labs and Tests Ordered: Current medicines are reviewed at length with the patient today.  Concerns regarding medicines are outlined above.  Orders Placed This Encounter  Procedures   EKG 12-Lead   Meds ordered this encounter  Medications   metoprolol tartrate (LOPRESSOR) 25 MG tablet    Sig: Take 1 tablet (25 mg total) by mouth 2 (two) times daily.    Dispense:  180 tablet    Refill:  3    Patient Instructions  Medication Instructions:  Your physician has recommended you make the following change in your medication:   INCREASE the Metoprolol 25 taking 1 tablet twice a day   *If you need a refill on your cardiac medications before your next appointment, please call your pharmacy*   Lab Work: None ordered  If you have labs (blood work) drawn today and your tests are completely normal, you will receive your results only by: [place] (if you have MyChart) OR A paper copy in the mail If you have any lab test that is abnormal or we need to change your treatment, we will call you to review the results.   Testing/Procedures: None ordered   Follow-Up: At [facility], you and your health needs are our priority.  As part of our continuing mission to provide you with exceptional heart care, we have created designated Provider Care Teams.  These Care Teams include your primary Cardiologist (physician) and  Advanced Practice Providers (APPs -  Physician Assistants and Nurse Practitioners) who all work together to provide you  with the care you need, when you need it.  We recommend signing up for the patient portal called "MyChart".  Sign up information is provided on this After Visit Summary.  MyChart is used to connect with patients for Virtual Visits (Telemedicine).  Patients are able to view lab/test results, encounter notes, upcoming appointments, etc.  Non-urgent messages can be sent to your provider as well.   To learn more about what you can do with MyChart, go to [email or web address].    Your next appointment:   12 month(s)  Provider:   Lauree Chandler, MD     Other Instructions    Signed, Trudi Ida, NP  06/06/2022 4:36 PM    [place]

## 2022-07-16 ENCOUNTER — Ambulatory Visit: Payer: 59 | Admitting: Cardiovascular Disease

## 2023-06-14 ENCOUNTER — Encounter: Payer: Self-pay | Admitting: Cardiovascular Disease

## 2023-06-14 ENCOUNTER — Ambulatory Visit
Payer: No Typology Code available for payment source | Attending: Cardiovascular Disease | Admitting: Cardiovascular Disease

## 2023-06-14 VITALS — BP 122/80 | HR 74 | Ht 65.0 in | Wt 217.8 lb

## 2023-06-14 DIAGNOSIS — I10 Essential (primary) hypertension: Secondary | ICD-10-CM | POA: Diagnosis not present

## 2023-06-14 DIAGNOSIS — E7800 Pure hypercholesterolemia, unspecified: Secondary | ICD-10-CM

## 2023-06-14 DIAGNOSIS — I25810 Atherosclerosis of coronary artery bypass graft(s) without angina pectoris: Secondary | ICD-10-CM

## 2023-06-14 NOTE — Progress Notes (Signed)
 Chief Complaint  Patient presents with   Follow-up    CAD   History of Present Illness: 63 yo male with history of CAD s/p CABG, HTN, HLD here today for cardiac followup. He was admitted in September 2012 with an inferolateral ST elevation myocardial infarction. He suffered V. Fib arrest in the emergency room and required defibrillation x2 and intubation. Cardiac cath 02/02/11 with 70-80% distal left main stenosis, 90% ostial LAD stenosis, occluded proximal Circumflex artery. He underwent PCI with thrombectomy and balloon angioplasty of the occluded circumflex. He was then taken for emergent bypass surgery. (LIMA to LAD, SVG to Diagonal, SVG to OM1&OM2). Echo January 2021 with LVEF=55-60%, no significant valve disease. Exercise stress test in January 2021 with no ischemia.   He is here today for follow up. The patient denies any chest pain, dyspnea, palpitations, lower extremity edema, orthopnea, PND, dizziness, near syncope or syncope.   Primary Care Physician: Theo Iha, MD  Past Medical History:  Diagnosis Date   CAD (coronary artery disease)    IL STEMI 9/12:  Cardiac catheterization 02/02/11: dLM 70-80%, oLAD 90%, pCFX occluded with L-R collats, EF 40% with inf HK.  He underwent salvage PCI with thrombectomy and balloon angioplasty of the occluded circumflex.  He was then taken for emergent bypass.  Grafts included L-LAD, S-Dx, S-OM1&OM2   HLD (hyperlipidemia)    HTN (hypertension)    Obesity     Past Surgical History:  Procedure Laterality Date   CARDIAC CATHETERIZATION  02/02/11   CORONARY ARTERY BYPASS GRAFT  02/03/11   Dental surgery on tooth that did not descend     WISDOM TOOTH EXTRACTION      Current Outpatient Medications  Medication Sig Dispense Refill   ascorbic acid (VITAMIN C) 1000 MG tablet Take 1,000 mg by mouth daily.     aspirin  EC 81 MG tablet Take 1 tablet (81 mg total) by mouth every other day. (Patient taking differently: Take 81 mg by mouth daily.) 90  tablet 3   atorvastatin  (LIPITOR) 40 MG tablet Take 40 mg by mouth daily.     cetirizine (ZYRTEC) 10 MG tablet Take 10 mg by mouth daily.     Cholecalciferol (VITAMIN D3) 50 MCG (2000 UT) TABS Take 1 tablet by mouth daily at 6 (six) AM.     Coenzyme Q10 300 MG CAPS Take 300 mg by mouth daily.     metoprolol  tartrate (LOPRESSOR ) 25 MG tablet Take 1 tablet (25 mg total) by mouth 2 (two) times daily. 180 tablet 3   No current facility-administered medications for this visit.    No Known Allergies  Social History   Socioeconomic History   Marital status: Married    Spouse name: Not on file   Number of children: 0   Years of education: Not on file   Highest education level: Not on file  Occupational History   Occupation: Information Technology    Employer: [employer]  Tobacco Use   Smoking status: Former    Current packs/day: 0.00    Average packs/day: 1 pack/day for 22.0 years (22.0 ttl pk-yrs)    Types: Cigarettes    Start date: 05/08/1975    Quit date: 05/07/1997    Years since quitting: 26.1   Smokeless tobacco: Former  Substance and Sexual Activity   Alcohol use: Yes    Alcohol/week: 1.0 standard drink of alcohol    Types: 1 Standard drinks or equivalent per week   Drug use: No   Sexual  activity: Not on file  Other Topics Concern   Not on file  Social History Narrative   Not on file   Social Drivers of Health   Financial Resource Strain: Not on file  Food Insecurity: Not on file  Transportation Needs: Not on file  Physical Activity: Not on file  Stress: Not on file  Social Connections: Not on file  Intimate Partner Violence: Not on file    Family History  Problem Relation Age of Onset   Cancer Father    Breast cancer Mother     Review of Systems:  As stated in the HPI and otherwise negative.   BP 122/80   Pulse 74   Ht 5' 5 (1.651 m)   Wt 98.8 kg   SpO2 93%   BMI 36.24 kg/m   Physical Examination:  General: Well developed, well  nourished, NAD  HEENT: OP clear, mucus membranes moist  SKIN: warm, dry. No rashes. Neuro: No focal deficits  Musculoskeletal: Muscle strength 5/5 all ext  Psychiatric: Mood and affect normal  Neck: No JVD, no carotid bruits, no thyromegaly, no lymphadenopathy.  Lungs:Clear bilaterally, no wheezes, rhonci, crackles Cardiovascular: Regular rate and rhythm. No murmurs, gallops or rubs. Abdomen:Soft. Bowel sounds present. Non-tender.  Extremities: No lower extremity edema. Pulses are 2 + in the bilateral DP/PT.  EKG:  EKG is ordered today. The ekg ordered today demonstrates  EKG Interpretation Date/Time:  Friday June 14 2023 07:48:48 EST Ventricular Rate:  74 PR Interval:  222 QRS Duration:  88 QT Interval:  372 QTC Calculation: 412 R Axis:   40  Text Interpretation: Sinus rhythm with 1st degree A-V block When compared with ECG of 06-Apr-2012 10:48, No significant change was found Confirmed by Verlin Bruckner [phone]) on 06/14/2023 7:54:08 AM    Recent Labs: No results found for requested labs within last 365 days.   Lipid Panel Followed in primary care Lipid Panel     Component Value Date/Time   CHOL 161 05/11/2019 0957   CHOL 143 04/06/2012 0047   TRIG 168 (H) 05/11/2019 0957   TRIG 145 04/06/2012 0047   HDL 48 05/11/2019 0957   HDL 36 (L) 04/06/2012 0047   CHOLHDL 3.4 05/11/2019 0957   VLDL 29 04/06/2012 0047   LDLCALC 84 05/11/2019 0957   LDLCALC 78 04/06/2012 0047   LABVLDL 29 05/11/2019 0957     Wt Readings from Last 3 Encounters:  06/14/23 98.8 kg  06/06/22 101 kg  07/04/21 99.2 kg    Assessment and Plan:   1. CAD s/p CABG without angina: No chest pain suggestive of angina.  Stress test normal in January 2021. Echo January 2021 with normal LV systolic function. Continue ASA, Lipitor and Metoprolol .   2. HTN: BP is controlled. Continue metoprolol      3. HLD: Lipids followed in primary care. Continue Lipitor  Labs/ tests ordered today include:    Orders Placed This Encounter  Procedures   EKG 12-Lead   Disposition:   F/U with me in  12  months  Signed, Bruckner Verlin, MD 06/14/2023 8:20 AM    [facility] [address] [place], [place], [place]  [address] Phone: [phone]; Fax: [phone]

## 2023-06-14 NOTE — Patient Instructions (Addendum)
 Medication Instructions:  No changes *If you need a refill on your cardiac medications before your next appointment, please call your pharmacy*   Lab Work: none If you have labs (blood work) drawn today and your tests are completely normal, you will receive your results only by: MyChart Message (if you have MyChart) OR A paper copy in the mail If you have any lab test that is abnormal or we need to change your treatment, we will call you to review the results.   Testing/Procedures: none   Follow-Up: At [facility], you and your health needs are our priority.  As part of our continuing mission to provide you with exceptional heart care, we have created designated Provider Care Teams.  These Care Teams include your primary Cardiologist (physician) and Advanced Practice Providers (APPs -  Physician Assistants and Nurse Practitioners) who all work together to provide you with the care you need, when you need it.   Your next appointment:   12 month(s)  Provider:   Verne Carrow, MD       1st Floor: - Lobby - Registration  - Pharmacy  - Lab - Cafe  2nd Floor: - PV Lab - Diagnostic Testing (echo, CT, nuclear med)  3rd Floor: - Vacant  4th Floor: - TCTS (cardiothoracic surgery) - AFib Clinic - Structural Heart Clinic - Vascular Surgery  - Vascular Ultrasound  5th Floor: - HeartCare Cardiology (general and EP) - Clinical Pharmacy for coumadin, hypertension, lipid, weight-loss medications, and med management appointments    Valet parking services will be available as well.
